# Patient Record
Sex: Female | Born: 1945 | Race: Black or African American | Hispanic: No | Smoking: Never smoker
Health system: Southern US, Community
[De-identification: ages and names within clinical notes are randomized; demographics above are authoritative.]

## PROBLEM LIST (undated history)

## (undated) DIAGNOSIS — E785 Hyperlipidemia, unspecified: Secondary | ICD-10-CM

## (undated) DIAGNOSIS — Z86711 Personal history of pulmonary embolism: Secondary | ICD-10-CM

## (undated) DIAGNOSIS — E538 Deficiency of other specified B group vitamins: Secondary | ICD-10-CM

## (undated) DIAGNOSIS — J302 Other seasonal allergic rhinitis: Secondary | ICD-10-CM

## (undated) DIAGNOSIS — K219 Gastro-esophageal reflux disease without esophagitis: Secondary | ICD-10-CM

## (undated) DIAGNOSIS — E7800 Pure hypercholesterolemia, unspecified: Secondary | ICD-10-CM

## (undated) DIAGNOSIS — R0789 Other chest pain: Secondary | ICD-10-CM

## (undated) DIAGNOSIS — M8580 Other specified disorders of bone density and structure, unspecified site: Secondary | ICD-10-CM

## (undated) DIAGNOSIS — G8929 Other chronic pain: Secondary | ICD-10-CM

## (undated) DIAGNOSIS — I10 Essential (primary) hypertension: Secondary | ICD-10-CM

## (undated) HISTORY — DX: Other chest pain: R07.89

## (undated) HISTORY — PX: TONSILLECTOMY: SUR1361

## (undated) HISTORY — DX: Hyperlipidemia, unspecified: E78.5

## (undated) HISTORY — PX: ABDOMINAL HYSTERECTOMY: SHX81

## (undated) HISTORY — DX: Deficiency of other specified B group vitamins: E53.8

## (undated) HISTORY — DX: Personal history of pulmonary embolism: Z86.711

## (undated) HISTORY — DX: Other specified disorders of bone density and structure, unspecified site: M85.80

## (undated) HISTORY — DX: Other seasonal allergic rhinitis: J30.2

## (undated) HISTORY — DX: Other chronic pain: G89.29

## (undated) HISTORY — PX: APPENDECTOMY: SHX54

## (undated) HISTORY — PX: NASAL SINUS SURGERY: SHX719

---

## 1981-07-02 DIAGNOSIS — Z86711 Personal history of pulmonary embolism: Secondary | ICD-10-CM

## 1981-07-02 HISTORY — DX: Personal history of pulmonary embolism: Z86.711

## 1998-01-16 ENCOUNTER — Emergency Department (HOSPITAL_COMMUNITY): Admission: EM | Admit: 1998-01-16 | Discharge: 1998-01-16 | Payer: Self-pay | Admitting: Emergency Medicine

## 1998-07-20 ENCOUNTER — Encounter: Admission: RE | Admit: 1998-07-20 | Discharge: 1998-07-20 | Payer: Self-pay | Admitting: Internal Medicine

## 1998-07-20 ENCOUNTER — Ambulatory Visit (HOSPITAL_COMMUNITY): Admission: RE | Admit: 1998-07-20 | Discharge: 1998-07-20 | Payer: Self-pay | Admitting: Internal Medicine

## 1998-08-03 ENCOUNTER — Encounter: Admission: RE | Admit: 1998-08-03 | Discharge: 1998-08-03 | Payer: Self-pay | Admitting: Internal Medicine

## 1998-08-04 ENCOUNTER — Ambulatory Visit (HOSPITAL_COMMUNITY): Admission: RE | Admit: 1998-08-04 | Discharge: 1998-08-04 | Payer: Self-pay | Admitting: *Deleted

## 1998-12-09 ENCOUNTER — Encounter: Payer: Self-pay | Admitting: Internal Medicine

## 1998-12-09 ENCOUNTER — Ambulatory Visit (HOSPITAL_COMMUNITY): Admission: RE | Admit: 1998-12-09 | Discharge: 1998-12-09 | Payer: Self-pay | Admitting: Internal Medicine

## 1998-12-09 ENCOUNTER — Encounter: Admission: RE | Admit: 1998-12-09 | Discharge: 1998-12-09 | Payer: Self-pay | Admitting: Internal Medicine

## 1999-01-18 ENCOUNTER — Encounter: Admission: RE | Admit: 1999-01-18 | Discharge: 1999-01-18 | Payer: Self-pay | Admitting: Hematology and Oncology

## 1999-03-16 ENCOUNTER — Encounter: Admission: RE | Admit: 1999-03-16 | Discharge: 1999-03-16 | Payer: Self-pay | Admitting: Internal Medicine

## 1999-04-21 ENCOUNTER — Encounter: Admission: RE | Admit: 1999-04-21 | Discharge: 1999-04-21 | Payer: Self-pay | Admitting: Internal Medicine

## 1999-09-29 ENCOUNTER — Encounter: Admission: RE | Admit: 1999-09-29 | Discharge: 1999-09-29 | Payer: Self-pay | Admitting: Internal Medicine

## 2000-02-09 ENCOUNTER — Encounter: Admission: RE | Admit: 2000-02-09 | Discharge: 2000-02-09 | Payer: Self-pay | Admitting: Internal Medicine

## 2000-02-09 ENCOUNTER — Ambulatory Visit (HOSPITAL_COMMUNITY): Admission: RE | Admit: 2000-02-09 | Discharge: 2000-02-09 | Payer: Self-pay | Admitting: Internal Medicine

## 2000-02-12 ENCOUNTER — Encounter: Admission: RE | Admit: 2000-02-12 | Discharge: 2000-03-19 | Payer: Self-pay | Admitting: Infectious Diseases

## 2000-02-14 ENCOUNTER — Ambulatory Visit (HOSPITAL_COMMUNITY): Admission: RE | Admit: 2000-02-14 | Discharge: 2000-02-14 | Payer: Self-pay

## 2000-02-21 ENCOUNTER — Encounter: Admission: RE | Admit: 2000-02-21 | Discharge: 2000-03-19 | Payer: Self-pay | Admitting: *Deleted

## 2000-08-23 ENCOUNTER — Encounter: Admission: RE | Admit: 2000-08-23 | Discharge: 2000-08-23 | Payer: Self-pay | Admitting: Internal Medicine

## 2000-10-04 ENCOUNTER — Encounter: Admission: RE | Admit: 2000-10-04 | Discharge: 2000-10-04 | Payer: Self-pay | Admitting: Internal Medicine

## 2001-04-15 ENCOUNTER — Encounter: Payer: Self-pay | Admitting: Emergency Medicine

## 2001-04-15 ENCOUNTER — Emergency Department (HOSPITAL_COMMUNITY): Admission: EM | Admit: 2001-04-15 | Discharge: 2001-04-15 | Payer: Self-pay | Admitting: Emergency Medicine

## 2001-09-12 ENCOUNTER — Encounter: Admission: RE | Admit: 2001-09-12 | Discharge: 2001-09-12 | Payer: Self-pay | Admitting: Internal Medicine

## 2001-10-06 ENCOUNTER — Encounter: Admission: RE | Admit: 2001-10-06 | Discharge: 2001-10-06 | Payer: Self-pay | Admitting: Internal Medicine

## 2001-10-10 ENCOUNTER — Ambulatory Visit (HOSPITAL_COMMUNITY): Admission: RE | Admit: 2001-10-10 | Discharge: 2001-10-10 | Payer: Self-pay | Admitting: Internal Medicine

## 2001-11-13 ENCOUNTER — Encounter: Admission: RE | Admit: 2001-11-13 | Discharge: 2001-11-13 | Payer: Self-pay | Admitting: Internal Medicine

## 2002-01-13 ENCOUNTER — Encounter: Admission: RE | Admit: 2002-01-13 | Discharge: 2002-01-13 | Payer: Self-pay | Admitting: Internal Medicine

## 2002-01-25 ENCOUNTER — Emergency Department (HOSPITAL_COMMUNITY): Admission: EM | Admit: 2002-01-25 | Discharge: 2002-01-25 | Payer: Self-pay | Admitting: *Deleted

## 2002-02-09 ENCOUNTER — Encounter: Admission: RE | Admit: 2002-02-09 | Discharge: 2002-02-09 | Payer: Self-pay | Admitting: Internal Medicine

## 2002-03-06 ENCOUNTER — Ambulatory Visit (HOSPITAL_COMMUNITY): Admission: RE | Admit: 2002-03-06 | Discharge: 2002-03-06 | Payer: Self-pay | Admitting: Internal Medicine

## 2002-03-06 ENCOUNTER — Encounter: Admission: RE | Admit: 2002-03-06 | Discharge: 2002-03-06 | Payer: Self-pay | Admitting: Internal Medicine

## 2002-04-27 ENCOUNTER — Encounter: Admission: RE | Admit: 2002-04-27 | Discharge: 2002-04-27 | Payer: Self-pay | Admitting: Internal Medicine

## 2002-05-08 ENCOUNTER — Encounter: Admission: RE | Admit: 2002-05-08 | Discharge: 2002-05-08 | Payer: Self-pay | Admitting: Internal Medicine

## 2002-06-21 ENCOUNTER — Encounter: Payer: Self-pay | Admitting: Internal Medicine

## 2002-06-21 ENCOUNTER — Ambulatory Visit (HOSPITAL_COMMUNITY): Admission: RE | Admit: 2002-06-21 | Discharge: 2002-06-21 | Payer: Self-pay | Admitting: Internal Medicine

## 2002-06-30 ENCOUNTER — Encounter: Admission: RE | Admit: 2002-06-30 | Discharge: 2002-06-30 | Payer: Self-pay | Admitting: Internal Medicine

## 2002-08-04 ENCOUNTER — Encounter: Admission: RE | Admit: 2002-08-04 | Discharge: 2002-08-04 | Payer: Self-pay | Admitting: Internal Medicine

## 2002-08-06 ENCOUNTER — Encounter: Admission: RE | Admit: 2002-08-06 | Discharge: 2002-08-06 | Payer: Self-pay | Admitting: Internal Medicine

## 2002-08-24 ENCOUNTER — Encounter: Admission: RE | Admit: 2002-08-24 | Discharge: 2002-08-24 | Payer: Self-pay | Admitting: Internal Medicine

## 2002-10-07 ENCOUNTER — Encounter: Admission: RE | Admit: 2002-10-07 | Discharge: 2002-10-07 | Payer: Self-pay | Admitting: Internal Medicine

## 2003-02-10 ENCOUNTER — Emergency Department (HOSPITAL_COMMUNITY): Admission: EM | Admit: 2003-02-10 | Discharge: 2003-02-10 | Payer: Self-pay | Admitting: Emergency Medicine

## 2003-02-10 ENCOUNTER — Encounter: Payer: Self-pay | Admitting: Emergency Medicine

## 2003-06-09 ENCOUNTER — Encounter: Admission: RE | Admit: 2003-06-09 | Discharge: 2003-06-09 | Payer: Self-pay | Admitting: Internal Medicine

## 2003-07-29 ENCOUNTER — Encounter: Admission: RE | Admit: 2003-07-29 | Discharge: 2003-07-29 | Payer: Self-pay | Admitting: Internal Medicine

## 2003-07-29 ENCOUNTER — Ambulatory Visit (HOSPITAL_COMMUNITY): Admission: RE | Admit: 2003-07-29 | Discharge: 2003-07-29 | Payer: Self-pay | Admitting: Internal Medicine

## 2003-08-06 ENCOUNTER — Emergency Department (HOSPITAL_COMMUNITY): Admission: AD | Admit: 2003-08-06 | Discharge: 2003-08-06 | Payer: Self-pay | Admitting: Family Medicine

## 2003-08-12 ENCOUNTER — Encounter: Admission: RE | Admit: 2003-08-12 | Discharge: 2003-08-12 | Payer: Self-pay | Admitting: Internal Medicine

## 2003-08-20 ENCOUNTER — Encounter: Payer: Self-pay | Admitting: Cardiovascular Disease

## 2003-08-20 ENCOUNTER — Ambulatory Visit (HOSPITAL_COMMUNITY): Admission: RE | Admit: 2003-08-20 | Discharge: 2003-08-20 | Payer: Self-pay | Admitting: Internal Medicine

## 2003-09-14 ENCOUNTER — Ambulatory Visit (HOSPITAL_COMMUNITY): Admission: RE | Admit: 2003-09-14 | Discharge: 2003-09-14 | Payer: Self-pay | Admitting: Internal Medicine

## 2003-09-21 ENCOUNTER — Encounter: Admission: RE | Admit: 2003-09-21 | Discharge: 2003-09-21 | Payer: Self-pay | Admitting: Internal Medicine

## 2003-12-07 ENCOUNTER — Encounter: Admission: RE | Admit: 2003-12-07 | Discharge: 2003-12-07 | Payer: Self-pay | Admitting: Internal Medicine

## 2003-12-07 ENCOUNTER — Ambulatory Visit (HOSPITAL_COMMUNITY): Admission: RE | Admit: 2003-12-07 | Discharge: 2003-12-07 | Payer: Self-pay | Admitting: Internal Medicine

## 2003-12-14 ENCOUNTER — Encounter: Admission: RE | Admit: 2003-12-14 | Discharge: 2003-12-14 | Payer: Self-pay | Admitting: Internal Medicine

## 2004-01-20 ENCOUNTER — Encounter: Admission: RE | Admit: 2004-01-20 | Discharge: 2004-01-20 | Payer: Self-pay | Admitting: Internal Medicine

## 2004-01-28 ENCOUNTER — Encounter: Admission: RE | Admit: 2004-01-28 | Discharge: 2004-01-28 | Payer: Self-pay | Admitting: Internal Medicine

## 2004-02-23 ENCOUNTER — Ambulatory Visit (HOSPITAL_COMMUNITY): Admission: RE | Admit: 2004-02-23 | Discharge: 2004-02-23 | Payer: Self-pay | Admitting: Orthopaedic Surgery

## 2004-03-01 ENCOUNTER — Encounter: Admission: RE | Admit: 2004-03-01 | Discharge: 2004-03-01 | Payer: Self-pay | Admitting: Internal Medicine

## 2004-03-02 ENCOUNTER — Ambulatory Visit: Payer: Self-pay | Admitting: Internal Medicine

## 2004-03-02 ENCOUNTER — Ambulatory Visit (HOSPITAL_COMMUNITY): Admission: RE | Admit: 2004-03-02 | Discharge: 2004-03-02 | Payer: Self-pay | Admitting: Internal Medicine

## 2004-07-21 ENCOUNTER — Encounter: Admission: RE | Admit: 2004-07-21 | Discharge: 2004-07-21 | Payer: Self-pay | Admitting: Sports Medicine

## 2004-08-10 ENCOUNTER — Ambulatory Visit (HOSPITAL_COMMUNITY): Admission: RE | Admit: 2004-08-10 | Discharge: 2004-08-10 | Payer: Self-pay | Admitting: Cardiology

## 2005-08-23 ENCOUNTER — Ambulatory Visit: Payer: Self-pay | Admitting: Internal Medicine

## 2005-09-18 ENCOUNTER — Ambulatory Visit: Payer: Self-pay | Admitting: Hospitalist

## 2005-10-11 ENCOUNTER — Ambulatory Visit: Payer: Self-pay | Admitting: Internal Medicine

## 2005-10-19 ENCOUNTER — Ambulatory Visit (HOSPITAL_COMMUNITY): Admission: RE | Admit: 2005-10-19 | Discharge: 2005-10-19 | Payer: Self-pay | Admitting: Internal Medicine

## 2005-10-26 ENCOUNTER — Emergency Department (HOSPITAL_COMMUNITY): Admission: EM | Admit: 2005-10-26 | Discharge: 2005-10-26 | Payer: Self-pay | Admitting: Family Medicine

## 2006-05-14 DIAGNOSIS — K219 Gastro-esophageal reflux disease without esophagitis: Secondary | ICD-10-CM

## 2006-05-14 DIAGNOSIS — Z86718 Personal history of other venous thrombosis and embolism: Secondary | ICD-10-CM

## 2006-05-14 DIAGNOSIS — J45909 Unspecified asthma, uncomplicated: Secondary | ICD-10-CM | POA: Insufficient documentation

## 2008-02-03 ENCOUNTER — Emergency Department (HOSPITAL_COMMUNITY): Admission: EM | Admit: 2008-02-03 | Discharge: 2008-02-03 | Payer: Self-pay | Admitting: Family Medicine

## 2008-02-09 ENCOUNTER — Emergency Department (HOSPITAL_COMMUNITY): Admission: EM | Admit: 2008-02-09 | Discharge: 2008-02-09 | Payer: Self-pay | Admitting: Emergency Medicine

## 2008-02-16 ENCOUNTER — Emergency Department (HOSPITAL_COMMUNITY): Admission: EM | Admit: 2008-02-16 | Discharge: 2008-02-16 | Payer: Self-pay | Admitting: Emergency Medicine

## 2008-05-11 ENCOUNTER — Encounter (INDEPENDENT_AMBULATORY_CARE_PROVIDER_SITE_OTHER): Payer: Self-pay | Admitting: Internal Medicine

## 2008-06-01 ENCOUNTER — Ambulatory Visit: Payer: Self-pay | Admitting: Surgery

## 2008-06-02 ENCOUNTER — Ambulatory Visit: Payer: Self-pay | Admitting: Internal Medicine

## 2008-06-02 ENCOUNTER — Encounter (INDEPENDENT_AMBULATORY_CARE_PROVIDER_SITE_OTHER): Payer: Self-pay | Admitting: Internal Medicine

## 2008-06-02 ENCOUNTER — Ambulatory Visit (HOSPITAL_COMMUNITY): Admission: RE | Admit: 2008-06-02 | Discharge: 2008-06-02 | Payer: Self-pay | Admitting: Internal Medicine

## 2008-06-02 ENCOUNTER — Encounter: Payer: Self-pay | Admitting: Internal Medicine

## 2008-06-07 LAB — CONVERTED CEMR LAB
ALT: 15 units/L (ref 0–35)
Albumin: 4.4 g/dL (ref 3.5–5.2)
CO2: 23 meq/L (ref 19–32)
Calcium: 9.7 mg/dL (ref 8.4–10.5)
Chloride: 107 meq/L (ref 96–112)
Cholesterol: 211 mg/dL — ABNORMAL HIGH (ref 0–200)
Creatinine, Ser: 0.83 mg/dL (ref 0.40–1.20)
HCT: 41.6 % (ref 36.0–46.0)
Hemoglobin: 13.6 g/dL (ref 12.0–15.0)
Platelets: 201 10*3/uL (ref 150–400)
Potassium: 3.7 meq/L (ref 3.5–5.3)
Total CHOL/HDL Ratio: 4.9
WBC: 6.8 10*3/uL (ref 4.0–10.5)

## 2008-06-16 ENCOUNTER — Ambulatory Visit: Payer: Self-pay | Admitting: Internal Medicine

## 2008-06-16 ENCOUNTER — Ambulatory Visit (HOSPITAL_COMMUNITY): Admission: RE | Admit: 2008-06-16 | Discharge: 2008-06-16 | Payer: Self-pay | Admitting: Internal Medicine

## 2008-06-16 ENCOUNTER — Encounter: Payer: Self-pay | Admitting: Licensed Clinical Social Worker

## 2008-06-28 ENCOUNTER — Encounter (INDEPENDENT_AMBULATORY_CARE_PROVIDER_SITE_OTHER): Payer: Self-pay | Admitting: Internal Medicine

## 2008-07-02 DIAGNOSIS — E538 Deficiency of other specified B group vitamins: Secondary | ICD-10-CM

## 2008-07-02 DIAGNOSIS — M8580 Other specified disorders of bone density and structure, unspecified site: Secondary | ICD-10-CM

## 2008-07-02 HISTORY — DX: Deficiency of other specified B group vitamins: E53.8

## 2008-07-02 HISTORY — DX: Other specified disorders of bone density and structure, unspecified site: M85.80

## 2008-07-05 ENCOUNTER — Emergency Department (HOSPITAL_COMMUNITY): Admission: EM | Admit: 2008-07-05 | Discharge: 2008-07-06 | Payer: Self-pay | Admitting: Emergency Medicine

## 2008-07-05 ENCOUNTER — Telehealth (INDEPENDENT_AMBULATORY_CARE_PROVIDER_SITE_OTHER): Payer: Self-pay | Admitting: Internal Medicine

## 2008-07-06 ENCOUNTER — Ambulatory Visit: Payer: Self-pay | Admitting: Internal Medicine

## 2008-07-15 ENCOUNTER — Ambulatory Visit (HOSPITAL_COMMUNITY): Admission: RE | Admit: 2008-07-15 | Discharge: 2008-07-15 | Payer: Self-pay | Admitting: Internal Medicine

## 2008-07-21 ENCOUNTER — Ambulatory Visit: Payer: Self-pay | Admitting: Internal Medicine

## 2008-07-21 ENCOUNTER — Encounter (INDEPENDENT_AMBULATORY_CARE_PROVIDER_SITE_OTHER): Payer: Self-pay | Admitting: Internal Medicine

## 2008-07-23 LAB — CONVERTED CEMR LAB
CO2: 21 meq/L (ref 19–32)
Chloride: 106 meq/L (ref 96–112)
Potassium: 3.7 meq/L (ref 3.5–5.3)
Sodium: 142 meq/L (ref 135–145)

## 2008-07-26 ENCOUNTER — Ambulatory Visit: Payer: Self-pay | Admitting: Internal Medicine

## 2008-07-27 ENCOUNTER — Ambulatory Visit: Payer: Self-pay | Admitting: Infectious Disease

## 2008-07-28 ENCOUNTER — Ambulatory Visit: Payer: Self-pay | Admitting: Internal Medicine

## 2008-07-29 ENCOUNTER — Ambulatory Visit: Payer: Self-pay | Admitting: Internal Medicine

## 2008-07-30 ENCOUNTER — Ambulatory Visit: Payer: Self-pay | Admitting: Internal Medicine

## 2008-08-05 ENCOUNTER — Emergency Department (HOSPITAL_COMMUNITY): Admission: EM | Admit: 2008-08-05 | Discharge: 2008-08-05 | Payer: Self-pay | Admitting: Emergency Medicine

## 2008-11-26 ENCOUNTER — Telehealth: Payer: Self-pay | Admitting: *Deleted

## 2008-11-30 LAB — HM DEXA SCAN

## 2008-12-02 ENCOUNTER — Emergency Department (HOSPITAL_COMMUNITY): Admission: EM | Admit: 2008-12-02 | Discharge: 2008-12-02 | Payer: Self-pay | Admitting: Emergency Medicine

## 2008-12-10 ENCOUNTER — Encounter (INDEPENDENT_AMBULATORY_CARE_PROVIDER_SITE_OTHER): Payer: Self-pay | Admitting: Internal Medicine

## 2008-12-10 ENCOUNTER — Ambulatory Visit: Payer: Self-pay | Admitting: Internal Medicine

## 2008-12-13 ENCOUNTER — Ambulatory Visit: Payer: Self-pay | Admitting: Internal Medicine

## 2008-12-21 ENCOUNTER — Ambulatory Visit (HOSPITAL_COMMUNITY): Admission: RE | Admit: 2008-12-21 | Discharge: 2008-12-21 | Payer: Self-pay | Admitting: Internal Medicine

## 2009-01-11 ENCOUNTER — Telehealth (INDEPENDENT_AMBULATORY_CARE_PROVIDER_SITE_OTHER): Payer: Self-pay | Admitting: Internal Medicine

## 2009-03-09 ENCOUNTER — Ambulatory Visit: Payer: Self-pay | Admitting: Infectious Diseases

## 2009-04-06 ENCOUNTER — Telehealth (INDEPENDENT_AMBULATORY_CARE_PROVIDER_SITE_OTHER): Payer: Self-pay | Admitting: Internal Medicine

## 2009-06-02 ENCOUNTER — Telehealth (INDEPENDENT_AMBULATORY_CARE_PROVIDER_SITE_OTHER): Payer: Self-pay | Admitting: Internal Medicine

## 2009-06-21 ENCOUNTER — Telehealth (INDEPENDENT_AMBULATORY_CARE_PROVIDER_SITE_OTHER): Payer: Self-pay | Admitting: Internal Medicine

## 2009-06-21 ENCOUNTER — Emergency Department (HOSPITAL_COMMUNITY): Admission: EM | Admit: 2009-06-21 | Discharge: 2009-06-21 | Payer: Self-pay | Admitting: Emergency Medicine

## 2009-06-29 ENCOUNTER — Ambulatory Visit: Payer: Self-pay | Admitting: Internal Medicine

## 2009-06-30 ENCOUNTER — Ambulatory Visit: Payer: Self-pay | Admitting: Internal Medicine

## 2009-07-01 ENCOUNTER — Encounter (INDEPENDENT_AMBULATORY_CARE_PROVIDER_SITE_OTHER): Payer: Self-pay | Admitting: Internal Medicine

## 2009-07-02 DIAGNOSIS — R0789 Other chest pain: Secondary | ICD-10-CM

## 2009-07-02 HISTORY — DX: Other chest pain: R07.89

## 2009-07-06 LAB — CONVERTED CEMR LAB
ALT: 13 units/L (ref 0–35)
AST: 18 units/L (ref 0–37)
CO2: 22 meq/L (ref 19–32)
Calcium: 10.3 mg/dL (ref 8.4–10.5)
Chloride: 110 meq/L (ref 96–112)
Cholesterol: 217 mg/dL — ABNORMAL HIGH (ref 0–200)
Sodium: 145 meq/L (ref 135–145)
Total CHOL/HDL Ratio: 4.4
Total Protein: 6.8 g/dL (ref 6.0–8.3)
VLDL: 18 mg/dL (ref 0–40)

## 2009-08-10 ENCOUNTER — Telehealth (INDEPENDENT_AMBULATORY_CARE_PROVIDER_SITE_OTHER): Payer: Self-pay | Admitting: Internal Medicine

## 2009-08-12 ENCOUNTER — Encounter (INDEPENDENT_AMBULATORY_CARE_PROVIDER_SITE_OTHER): Payer: Self-pay | Admitting: Internal Medicine

## 2009-08-12 ENCOUNTER — Telehealth (INDEPENDENT_AMBULATORY_CARE_PROVIDER_SITE_OTHER): Payer: Self-pay | Admitting: Internal Medicine

## 2009-09-05 ENCOUNTER — Ambulatory Visit: Payer: Self-pay | Admitting: Infectious Diseases

## 2009-09-05 ENCOUNTER — Ambulatory Visit (HOSPITAL_COMMUNITY): Admission: RE | Admit: 2009-09-05 | Discharge: 2009-09-05 | Payer: Self-pay | Admitting: Infectious Diseases

## 2009-09-05 DIAGNOSIS — I10 Essential (primary) hypertension: Secondary | ICD-10-CM | POA: Insufficient documentation

## 2009-09-07 ENCOUNTER — Ambulatory Visit: Payer: Self-pay | Admitting: Internal Medicine

## 2009-09-07 LAB — FECAL OCCULT BLOOD, GUAIAC: Fecal Occult Blood: POSITIVE

## 2009-09-08 ENCOUNTER — Encounter: Admission: RE | Admit: 2009-09-08 | Discharge: 2009-09-08 | Payer: Self-pay | Admitting: Internal Medicine

## 2009-09-08 LAB — HM MAMMOGRAPHY: HM Mammogram: NEGATIVE

## 2009-09-12 LAB — CONVERTED CEMR LAB
OCCULT 1: NEGATIVE
OCCULT 2: POSITIVE
OCCULT 3: POSITIVE

## 2009-09-13 ENCOUNTER — Encounter (INDEPENDENT_AMBULATORY_CARE_PROVIDER_SITE_OTHER): Payer: Self-pay | Admitting: Internal Medicine

## 2009-09-15 ENCOUNTER — Encounter (INDEPENDENT_AMBULATORY_CARE_PROVIDER_SITE_OTHER): Payer: Self-pay | Admitting: Internal Medicine

## 2009-09-16 ENCOUNTER — Encounter: Payer: Self-pay | Admitting: Family Medicine

## 2009-09-16 ENCOUNTER — Ambulatory Visit: Payer: Self-pay | Admitting: Sports Medicine

## 2009-09-28 ENCOUNTER — Encounter: Admission: RE | Admit: 2009-09-28 | Discharge: 2009-12-27 | Payer: Self-pay | Admitting: Sports Medicine

## 2009-10-24 ENCOUNTER — Telehealth (INDEPENDENT_AMBULATORY_CARE_PROVIDER_SITE_OTHER): Payer: Self-pay | Admitting: Internal Medicine

## 2009-10-28 ENCOUNTER — Ambulatory Visit: Payer: Self-pay | Admitting: Family Medicine

## 2009-10-30 ENCOUNTER — Encounter: Payer: Self-pay | Admitting: Internal Medicine

## 2009-10-31 ENCOUNTER — Encounter: Payer: Self-pay | Admitting: Sports Medicine

## 2009-11-03 ENCOUNTER — Encounter: Admission: RE | Admit: 2009-11-03 | Discharge: 2010-02-01 | Payer: Self-pay | Admitting: Family Medicine

## 2009-11-04 ENCOUNTER — Ambulatory Visit: Payer: Self-pay | Admitting: Sports Medicine

## 2009-11-29 ENCOUNTER — Ambulatory Visit: Payer: Self-pay | Admitting: Internal Medicine

## 2009-11-29 ENCOUNTER — Encounter (INDEPENDENT_AMBULATORY_CARE_PROVIDER_SITE_OTHER): Payer: Self-pay | Admitting: Internal Medicine

## 2009-11-29 ENCOUNTER — Observation Stay (HOSPITAL_COMMUNITY): Admission: EM | Admit: 2009-11-29 | Discharge: 2009-12-01 | Payer: Self-pay | Admitting: Emergency Medicine

## 2009-11-29 LAB — CONVERTED CEMR LAB
Cholesterol: 219 mg/dL
LDL Cholesterol: 156 mg/dL
Triglycerides: 86 mg/dL

## 2009-11-30 ENCOUNTER — Encounter: Payer: Self-pay | Admitting: Internal Medicine

## 2009-12-01 ENCOUNTER — Encounter: Payer: Self-pay | Admitting: Family Medicine

## 2009-12-09 ENCOUNTER — Ambulatory Visit: Payer: Self-pay | Admitting: Family Medicine

## 2009-12-13 ENCOUNTER — Ambulatory Visit: Payer: Self-pay | Admitting: Internal Medicine

## 2009-12-29 ENCOUNTER — Encounter: Admission: RE | Admit: 2009-12-29 | Discharge: 2010-03-24 | Payer: Self-pay | Admitting: Sports Medicine

## 2010-01-12 ENCOUNTER — Ambulatory Visit: Payer: Self-pay | Admitting: Internal Medicine

## 2010-01-26 ENCOUNTER — Telehealth: Payer: Self-pay | Admitting: Internal Medicine

## 2010-02-01 ENCOUNTER — Telehealth: Payer: Self-pay | Admitting: Internal Medicine

## 2010-02-02 ENCOUNTER — Telehealth: Payer: Self-pay | Admitting: Internal Medicine

## 2010-02-20 ENCOUNTER — Encounter: Payer: Self-pay | Admitting: Internal Medicine

## 2010-02-21 ENCOUNTER — Ambulatory Visit: Payer: Self-pay | Admitting: Internal Medicine

## 2010-02-21 ENCOUNTER — Encounter: Payer: Self-pay | Admitting: Licensed Clinical Social Worker

## 2010-02-21 DIAGNOSIS — G4700 Insomnia, unspecified: Secondary | ICD-10-CM

## 2010-02-28 ENCOUNTER — Telehealth: Payer: Self-pay | Admitting: *Deleted

## 2010-03-24 ENCOUNTER — Ambulatory Visit: Payer: Self-pay | Admitting: Internal Medicine

## 2010-03-24 ENCOUNTER — Telehealth: Payer: Self-pay | Admitting: Internal Medicine

## 2010-03-28 ENCOUNTER — Telehealth: Payer: Self-pay | Admitting: Internal Medicine

## 2010-04-04 ENCOUNTER — Encounter: Payer: Self-pay | Admitting: Family Medicine

## 2010-04-11 ENCOUNTER — Telehealth: Payer: Self-pay | Admitting: Internal Medicine

## 2010-04-20 ENCOUNTER — Ambulatory Visit: Payer: Self-pay | Admitting: Internal Medicine

## 2010-04-27 ENCOUNTER — Ambulatory Visit: Payer: Self-pay | Admitting: Internal Medicine

## 2010-05-03 ENCOUNTER — Telehealth: Payer: Self-pay | Admitting: *Deleted

## 2010-05-08 ENCOUNTER — Telehealth: Payer: Self-pay

## 2010-05-18 ENCOUNTER — Telehealth: Payer: Self-pay | Admitting: Internal Medicine

## 2010-05-31 ENCOUNTER — Telehealth: Payer: Self-pay | Admitting: *Deleted

## 2010-05-31 ENCOUNTER — Ambulatory Visit: Payer: Self-pay | Admitting: Internal Medicine

## 2010-06-05 ENCOUNTER — Ambulatory Visit: Payer: Self-pay | Admitting: Family Medicine

## 2010-06-05 ENCOUNTER — Encounter: Admission: RE | Admit: 2010-06-05 | Discharge: 2010-06-05 | Payer: Self-pay | Admitting: Family Medicine

## 2010-06-09 ENCOUNTER — Telehealth: Payer: Self-pay | Admitting: Internal Medicine

## 2010-06-09 ENCOUNTER — Encounter: Payer: Self-pay | Admitting: Family Medicine

## 2010-06-12 ENCOUNTER — Ambulatory Visit: Payer: Self-pay | Admitting: Family Medicine

## 2010-06-28 ENCOUNTER — Encounter
Admission: RE | Admit: 2010-06-28 | Discharge: 2010-07-03 | Payer: Self-pay | Source: Home / Self Care | Attending: Family Medicine | Admitting: Family Medicine

## 2010-07-03 ENCOUNTER — Encounter: Admission: RE | Admit: 2010-07-03 | Payer: Self-pay | Source: Home / Self Care | Admitting: Sports Medicine

## 2010-07-03 ENCOUNTER — Encounter
Admission: RE | Admit: 2010-07-03 | Discharge: 2010-08-01 | Payer: Self-pay | Source: Home / Self Care | Attending: Family Medicine | Admitting: Family Medicine

## 2010-07-05 ENCOUNTER — Ambulatory Visit
Admission: RE | Admit: 2010-07-05 | Discharge: 2010-07-05 | Payer: Self-pay | Source: Home / Self Care | Attending: Internal Medicine | Admitting: Internal Medicine

## 2010-07-06 ENCOUNTER — Encounter: Admit: 2010-07-06 | Payer: Self-pay | Admitting: Family Medicine

## 2010-07-23 ENCOUNTER — Encounter: Payer: Self-pay | Admitting: Orthopaedic Surgery

## 2010-08-01 ENCOUNTER — Ambulatory Visit: Admit: 2010-08-01 | Payer: Self-pay | Admitting: Internal Medicine

## 2010-08-01 NOTE — Progress Notes (Signed)
Summary: refill/gg  Phone Note Refill Request  on May 18, 2010 2:09 PM  Refills Requested: Medication #1:  PROVENTIL HFA 108 (90 BASE) MCG/ACT AERS 2 puffs every four hours as needed for shortness of breath. Pt states she uses 3 inhalers a month. SHe uses 2 puffs every 4 hours.  she needs a 3 month supply.     Method Requested: Electronic Initial call taken by: Merrie Roof RN,  May 18, 2010 2:09 PM  Follow-up for Phone Call        When I saw her in Aug she stated only used as needed - usally when allergies flaired. If she requires it q 4 around the clock she needs seen ASAP and et PFT's .  There are 200 doses in each meter. Using 3 meters per month equals 20 puffs per day. One MDI should last acouple of months,  I rx #1 on 04/11/10 and gave 5 refills. I gave one MDI to give her time to get here and be evaluated. Follow-up by: Blanch Media MD,  May 18, 2010 3:58 PM  Additional Follow-up for Phone Call Additional follow up Details #1::        pt has appointment 11/22 with Dr Rogelia Boga. Additional Follow-up by: Merrie Roof RN,  May 22, 2010 4:20 PM    Prescriptions: PROVENTIL HFA 108 (90 BASE) MCG/ACT AERS (ALBUTEROL SULFATE) 2 puffs every four hours as needed for shortness of breath.  #1 x 0   Entered and Authorized by:   Blanch Media MD   Signed by:   Blanch Media MD on 05/18/2010   Method used:   Faxed to ...       [facility] Fax) (mail-order)             ,          Ph: [phone]       Fax: [phone]   RxID:   [id number]

## 2010-08-01 NOTE — Assessment & Plan Note (Signed)
Summary: LEFT LEG PAIN,MC   Vital Signs:  Patient profile:   65 year old female BP sitting:   139 / 81  Vitals Entered By: Lillia Pauls CMA (June 05, 2010 1:32 PM)  Primary Care Provider:  Blanch Media MD   History of Present Illness: RIGHT shoulder pain--worse withoverhgead movement. Has had similar issues many years ago with left shoulder--she received an injection and it has not bothered her since, Pain is sharp, not aching or throbbing. No hand or arm numbness or weakness. No specific injury . Has gradually gotten worse over last few weeks.   Right > left but B groin pain--thinks it is her hip on the right causing most of the issues. Worse wuth walking--has to stop after a few minutes of walking. Not keeping her awake at night. No falls. No leg numbnessor tingling. No urinary incontinence. No back ir hip injury or surgery  Current Medications (verified): 1)  Nexium 20 Mg Cpdr (Esomeprazole Magnesium) .... Take One Capsule Daily. 2)  Proventil Hfa 108 (90 Base) Mcg/act Aers (Albuterol Sulfate) .... 2 Puffs Every Four Hours As Needed For Shortness of Breath. 3)  Metoprolol Succinate 25 Mg Xr24h-Tab (Metoprolol Succinate) .... Take 1 Tablet By Mouth Once A Day 4)  Cyanocobalamin 1000 Mcg/ml Soln (Cyanocobalamin) .... Inject 1 Ml Every Day For One Week and Then Once A Week For One Month and Then Once A Month. 5)  Caltrate 600+d Plus 600-400 Mg-Unit Tabs (Calcium Carbonate-Vit D-Min) .... Take 1 Tablet By Mouth Two Times A Day 6)  Ambien 10 Mg Tabs (Zolpidem Tartrate) .... Take 1 Tablet By Mouth At Bedtime As Needed For Sleeping Problems 7)  Simvastatin 20 Mg Tabs (Simvastatin) .... Take 1 Tablet By Mouth At Bedtime For Cholesterol 8)  Aspirin Ec 81 Mg Tbec (Aspirin) .... Take 1 Tablet By Mouth Once A Day 9)  Patanol 0.1 % Soln (Olopatadine Hcl) .Marland Kitchen.. 1 Drop Into Both Eyes Two Times A Day  For Allergies  Allergies: 1)  ! Amoxicillin 2)  ! Aspirin 3)  ! Pcn 4)  ! * Nose Sprays  - Inc Steroid Sprays  Past History:  Past Medical History: Last updated: 02/20/2010 Thrombosis     H/o DVT: L LE '85,'87,'89     H/o Pulmonary embolism:1983  Asthma     Albuterol MDI as needed  GERD       Maintained on PPI  Osteopenia     DEXA 6/10 T scores lumbar spine -2.2 to -2.3  Hyperlipidemia     Maintained on statin  HTN  Vit B 12 def     Dx and started tx 1/10     B 12 level decreased to 171 May 2011  Insomnia    Pain syndrome     OA R hip - evaluated by orth     Trochanteric bursitis s/p steroid injection     L foot pain 2/2 old fx  Seasonal allergies  Atypical CP     Myoview negative 5/11     EF 63%  Past Surgical History: Last updated: 05/14/2006 S/p Appendectomy S/p Hysterectomy-partial S/p Oophorectomy S/p Sinus surgery S/p Tonsillectomy  Social History: Last updated: 07/06/2008 Retired Copywriter, 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    Physical Exam  General:  alert, well-developed, well-nourished, and well-hydrated.   Msk:  RIGHT shoulder: pain  with supraspinatus testing but intact strength in all planes of rotator cuff. Distally neurovascularly iuntact. Limited IR on right compared with left  HIPS B have full painless IR/ER. cannot reproduce her pain with hip exam. LE strength 5/5 symmetrical.   GAIT normal.   BACK SLR negative B. normal flexion and hyperextension for age.  BUTTOCKS nontender to palpation over SI joint. + FABER right   Impression & Recommendations:  Problem # 1:  PAIN IN JOINT PELVIC REGION AND THIGH (ICD-719.45) Unclear if her low back and groin pain are from same problem (likely back) or if she has some back issues and SI joiint issues. Will start with plain films of back and I wil call her after we see those anddecide on next  step.  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  Complete Medication List: 1)  Nexium 20 Mg Cpdr (Esomeprazole magnesium) .... Take one capsule daily. 2)  Proventil Hfa 108 (90 Base) Mcg/act Aers (Albuterol sulfate) .... 2 puffs every four hours as needed for shortness of breath. 3)  Metoprolol Succinate 25 Mg Xr24h-tab (Metoprolol succinate) .... Take 1 tablet by mouth once a day 4)  Cyanocobalamin 1000 Mcg/ml Soln (Cyanocobalamin) .... Inject 1 ml every day for one week and then once a week for one month and then once a month. 5)  Caltrate 600+d Plus 600-400 Mg-unit Tabs (Calcium carbonate-vit d-min) .... Take 1 tablet by mouth two times a day 6)  Ambien 10 Mg Tabs (Zolpidem tartrate) .... Take 1 tablet by mouth at bedtime as needed for sleeping problems 7)  Simvastatin 20 Mg Tabs (Simvastatin) .... Take 1 tablet by mouth at bedtime for cholesterol 8)  Aspirin Ec 81 Mg Tbec (Aspirin) .... Take 1 tablet by mouth once a day 9)  Patanol 0.1 % Soln (Olopatadine hcl) .Marland Kitchen.. 1 drop into both eyes two times a day  for allergies Radiology other (Radiology Other)   Orders Added: 1)  Radiology other [Radiology Other] 2)  Est. Patient Level IV [16109]     Impression & Recommendations:  Her updated medication list for this problem includes:    Aspirin Ec 81 Mg Tbec (Aspirin) .Marland Kitchen... Take 1 tablet by mouth once a day   Her updated medication list for this problem includes:    Aspirin Ec 81 Mg Tbec (Aspirin) .Marland Kitchen... Take 1 tablet by mouth once a day  Orders: Radiology other (Radiology Other)   Her updated medication list for this problem includes:    Caltrate 600+d Plus 600-400 Mg-unit Tabs (Calcium carbonate-vit d-min) .Marland Kitchen... Take 1 tablet by mouth two times a day   Her updated  medication list for this problem includes:    Aspirin Ec 81 Mg Tbec (Aspirin) .Marland Kitchen... Take 1 tablet by mouth once a day   Complete Medication List: 1)  Nexium 20 Mg Cpdr (Esomeprazole magnesium) .... Take one capsule daily. 2)  Proventil Hfa 108 (90 Base) Mcg/act Aers (Albuterol sulfate) .... 2 puffs every four hours as needed for shortness of breath. 3)  Metoprolol Succinate 25 Mg Xr24h-tab (Metoprolol succinate) .... Take 1 tablet by mouth once a day 4)  Cyanocobalamin 1000 Mcg/ml Soln (Cyanocobalamin) .... Inject 1 ml every day for one week and then once a week for one month and then once a month. 5)  Caltrate  600+d Plus 600-400 Mg-unit Tabs (Calcium carbonate-vit d-min) .... Take 1 tablet by mouth two times a day 6)  Ambien 10 Mg Tabs (Zolpidem tartrate) .... Take 1 tablet by mouth at bedtime as needed for sleeping problems 7)  Simvastatin 20 Mg Tabs (Simvastatin) .... Take 1 tablet by mouth at bedtime for cholesterol 8)  Aspirin Ec 81 Mg Tbec (Aspirin) .... Take 1 tablet by mouth once a day 9)  Patanol 0.1 % Soln (Olopatadine hcl) .Marland Kitchen.. 1 drop into both eyes two times a day  for allergies

## 2010-08-01 NOTE — Miscellaneous (Signed)
Summary: [facility]  [facility]   Imported By: Marily Memos 04/04/2010 10:18:12  _____________________________________________________________________  External Attachment:    Type:   Image     Comment:   External Document

## 2010-08-01 NOTE — Medication Information (Signed)
Summary: Tax adviser   Imported By: Florinda Marker 08/15/2009 15:13:42  _____________________________________________________________________  External Attachment:    Type:   Image     Comment:   External Document

## 2010-08-01 NOTE — Letter (Signed)
Summary: [employer]  C[employer]   Imported By: Margie Billet 10/12/2009 12:25:00  _____________________________________________________________________  External Attachment:    Type:   Image     Comment:   External Document

## 2010-08-01 NOTE — Progress Notes (Signed)
Summary: Refill/gh  Phone Note Refill Request Message from:  Fax from Pharmacy on October 24, 2009 10:12 AM  Refills Requested: Medication #1:  ALLEGRA 180 MG TABS Take 1 tablet by mouth once a day   Last Refilled: 03/07/2009  Method Requested: Electronic Initial call taken by: Angelina Ok RN,  October 24, 2009 10:13 AM    Prescriptions: ALLEGRA 180 MG TABS (FEXOFENADINE HCL) Take 1 tablet by mouth once a day  #90 x 6   Entered and Authorized by:   Elby Showers MD   Signed by:   Elby Showers MD on 10/25/2009   Method used:   [facility]        [facility] [address] [phone]* (retail)       [address]       [place], [place]  [address]       Ph: [phone] or [phone]       Fax: [phone]   RxID:   [id number]

## 2010-08-01 NOTE — Letter (Signed)
Summary: [facility] PT referral form  [facility] referral [facility]   Imported By: Marily Memos 10/28/2009 10:19:53  _____________________________________________________________________  External Attachment:    Type:   Image     Comment:   External Document

## 2010-08-01 NOTE — Letter (Signed)
Summary: [facility] Referral [facility]  [facility] Referral [facility]   Imported By: Marily Memos 09/16/2009 11:20:02  _____________________________________________________________________  External Attachment:    Type:   Image     Comment:   External Document

## 2010-08-01 NOTE — Progress Notes (Signed)
Summary: refill/ hla  Phone Note Refill Request Message from:  Patient on March 24, 2010 3:47 PM  Refills Requested: Medication #1:  ALLEGRA 180 MG TABS Take 1 tablet by mouth once a day   Dosage confirmed as above?Dosage Confirmed last visit 8/23  Initial call taken by: Marin Roberts RN,  March 24, 2010 3:47 PM  Follow-up for Phone Call       Follow-up by: Blanch Media MD,  March 24, 2010 4:10 PM    Prescriptions: ALLEGRA 180 MG TABS (FEXOFENADINE HCL) Take 1 tablet by mouth once a day  #90 x 3   Entered and Authorized by:   Blanch Media MD   Signed by:   Blanch Media MD on 03/24/2010   Method used:   [facility]        [facility] [phone]* (retail)       [address]       [place], [place]  [address]       Ph: [phone] or [phone]       Fax: [phone]   RxID:   [id number]

## 2010-08-01 NOTE — Progress Notes (Signed)
Summary: med refill/gp  Phone Note Refill Request Message from:  Patient on January 26, 2010 12:01 PM  Refills Requested: Medication #1:  ALBUTEROL SULFATE  NEBU Inhale 2 puffs every 4-6hours as needed  Medication #2:  NEXIUM 20 MG CPDR Take one capsule daily.  Medication #3:  AMBIEN 10 MG TABS Take 1 tablet by mouth once a day Requests 90 day supplies. Next appt. Aug 8.   Method Requested: Telephone to Pharmacy Initial call taken by: Chinita Pester RN,  January 26, 2010 12:01 PM  Follow-up for Phone Call        Would you pls call Ambien to the pharmacy?  Thanks Follow-up by: Blanch Media MD,  January 26, 2010 1:52 PM  Additional Follow-up for Phone Call Additional follow up Details #1::        Ambien Rx called to pharmacy - [facility]. Additional Follow-up by: Chinita Pester RN,  January 27, 2010 11:32 AM    Prescriptions: AMBIEN 10 MG TABS (ZOLPIDEM TARTRATE) Take 1 tablet by mouth once a day  #90 x 0   Entered and Authorized by:   Blanch Media MD   Signed by:   Blanch Media MD on 01/26/2010   Method used:   Telephoned to ...       [facility] Fax) (mail-order)             ,          Ph: [phone]       Fax: [phone]   RxID:   [id number] NEXIUM 20 MG CPDR (ESOMEPRAZOLE MAGNESIUM) Take one capsule daily.  #90 x 3   Entered and Authorized by:   Blanch Media MD   Signed by:   Blanch Media MD on 01/26/2010   Method used:   Faxed to ...       [facility] Fax) (mail-order)             ,          Ph: [phone]       Fax: [phone]   RxID:   [id number] ALBUTEROL SULFATE  NEBU (ALBUTEROL SULFATE NEBU) Inhale 2 puffs every 4-6hours as needed  #3 x 0   Entered and Authorized by:   Blanch Media MD   Signed by:   Blanch Media MD on 01/26/2010   Method used:   Faxed to ...       [facility] Fax) (mail-order)             ,          Ph: [phone]       Fax: [phone]  RxID:   [id number]

## 2010-08-01 NOTE — Consult Note (Signed)
Summary: [facility]   Imported By: Florinda Marker 09/19/2009 15:34:56  _____________________________________________________________________  External Attachment:    Type:   Image     Comment:   External Document

## 2010-08-01 NOTE — Assessment & Plan Note (Signed)
Summary: FU FOOT & RT SHOULDER/MJD   Vital Signs:  Patient profile:   65 year old female BP sitting:   141 / 83  Vitals Entered By: Lillia Pauls CMA (December 09, 2009 8:47 AM)  Primary Care Provider:  Elby Showers MD   History of Present Illness: f/u right shoulder pain---90% improved. finished her PT.  2) Right hip pain also greatly improved (90-95%)  3) Now that shoulder on r  is better she realizes her right neck and  trapezius still hurt. Also some neck stiffness. No neck injury.  Allergies: 1)  ! Amoxicillin 2)  ! Aspirin 3)  ! Pcn  Physical Exam  Neck:  Supple, has normal full flexion and extension that is painless. She has full ear-to-shoulder on left but only about 50% of that motion on right. Shoulder shrug is normal, a little painful  TTP in trapezius muscle belly on right. No defect or atrophy of muscle noted.  Shoulder Right: Full overhead arm raise. Slight pain with supraspinatus testing  but full symmetrical strength. IR/ER 5/5 painless   Impression & Recommendations:  Problem # 1:  NECK PAIN (ICD-723.1) She did so well with shoulder pT I think she will benefit from neck pT. RTC as needed, PT referral made  Problem # 2:  SACROILIAC JOINT DYSFUNCTION (ICD-724.6) greatly improved after her injection although she did have a steroid flair--it is now resolved  Complete Medication List: 1)  Nexium 20 Mg Cpdr (Esomeprazole magnesium) .... Take one capsule daily. 2)  Albuterol Sulfate Nebu (Albuterol sulfate nebu) .... Inhale 2 puffs every 4-6hours as needed 3)  Metoprolol Succinate 25 Mg Xr24h-tab (Metoprolol succinate) .... Once daily 4)  Advil 200 Mg Caps (Ibuprofen) .... 2 tabs every eight hours as needed for pain. 5)  Cyanocobalamin 1000 Mcg/ml Soln (Cyanocobalamin) .... Inject 1 ml every day for one week and then once a week for one month and then once a month. 6)  Allegra 180 Mg Tabs (Fexofenadine hcl) .... Take 1 tablet by mouth once a day 7)  Caltrate  600+d Plus 600-400 Mg-unit Tabs (Calcium carbonate-vit d-min) .... Take 1 tablet by mouth two times a day 8)  Ambien 10 Mg Tabs (Zolpidem tartrate) .... Take 1 tablet by mouth once a day 9)  Vicodin 5-500 Mg Tabs (Hydrocodone-acetaminophen) .... One by mouth q4hrs as needed pain 10)  Simvastatin 20 Mg Tabs (Simvastatin) .... Take 1 tablet by mouth once a day 11)  Aspirin Ec 81 Mg Tbec (Aspirin) .... Take 1 tablet by mouth once a day

## 2010-08-01 NOTE — Miscellaneous (Signed)
Clinical Lists Changes  Observations: Added new observation of PAST MED HX: Thrombosis     H/o DVT: L LE '85,'87,'89     H/o Pulmonary embolism:1983  Asthma     Albuterol MDI as needed  GERD       Maintained on PPI  Hyperlipidemia     Maintained on statin  HTN  Vit B 12 def     Dx and started tx 1/10     B 12 level decreased to 171 May 2011  Insomnia    Pain syndrome     OA R hip - evaluated by orth     Trochanteric bursitis s/p steroid injection     L foot pain 2/2 old fx  Seasonal allergies  Atypical CP     Myoview negative 5/11     EF 63%     (02/20/2010 19:57) Added new observation of LDL: 156 mg/dL (63/87/5643 32:95) Added new observation of HDL: 46 mg/dL (18/84/1660 63:01) Added new observation of TRIGLYC TOT: 86 mg/dL (60/04/9322 55:73) Added new observation of CHOLESTEROL: 219 mg/dL (22/08/5425 06:23) Added new observation of B12: 171 pg/mL (10/30/2009 19:57)      Past History:  Past Medical History: Thrombosis     H/o DVT: L LE '85,'87,'89     H/o Pulmonary embolism:1983  Asthma     Albuterol MDI as needed  GERD       Maintained on PPI  Hyperlipidemia     Maintained on statin  HTN  Vit B 12 def     Dx and started tx 1/10     B 12 level decreased to 171 May 2011  Insomnia    Pain syndrome     OA R hip - evaluated by orth     Trochanteric bursitis s/p steroid injection     L foot pain 2/2 old fx  Seasonal allergies  Atypical CP     Myoview negative 5/11     EF 63%  Appended Document:     Clinical Lists Changes  Observations: Added new observation of PAST MED HX: Thrombosis     H/o DVT: L LE '85,'87,'89     H/o Pulmonary embolism:1983  Asthma     Albuterol MDI as needed  GERD       Maintained on PPI  Osteopenia     DEXA 6/10 T scores lumbar spine -2.2 to -2.3  Hyperlipidemia     Maintained on statin  HTN  Vit B 12 def     Dx and started tx 1/10     B 12 level decreased to 171 May 2011  Insomnia   Pain syndrome     OA R hip - evaluated by orth     Trochanteric bursitis s/p steroid injection     L foot pain 2/2 old fx  Seasonal allergies  Atypical CP     Myoview negative 5/11     EF 63%     (02/20/2010 20:08) Added new observation of BONE DENSITY: Lumbar spine T scores -2.2 to -2.3 (11/30/2008 20:08)       Past History:  Past Medical History: Thrombosis     H/o DVT: L LE '85,'87,'89     H/o Pulmonary embolism:1983  Asthma     Albuterol MDI as needed  GERD       Maintained on PPI  Osteopenia     DEXA 6/10 T scores lumbar spine -2.2 to -2.3  Hyperlipidemia     Maintained on statin  HTN  Vit  B 12 def     Dx and started tx 1/10     B 12 level decreased to 171 May 2011  Insomnia    Pain syndrome     OA R hip - evaluated by orth     Trochanteric bursitis s/p steroid injection     L foot pain 2/2 old fx  Seasonal allergies  Atypical CP     Myoview negative 5/11     EF 63%

## 2010-08-01 NOTE — Op Note (Signed)
Summary: consent  consent   Imported By: Marily Memos 06/05/2010 15:17:48  _____________________________________________________________________  External Attachment:    Type:   Image     Comment:   External Document

## 2010-08-01 NOTE — Progress Notes (Signed)
Summary: Stomach upset  Phone Note Call from Patient Message from:  Patient on May 31, 2010 10:14 AM  Summary of Call: Pt called said that she is having vomiting and diarrhea x 2 days.  Feels nauseaous now.  Little lightheaded today.   No fevers or chills. Angelina Ok RN  May 31, 2010 10:42 AM

## 2010-08-01 NOTE — Progress Notes (Signed)
Summary: phone/gg  Phone Note Call from Patient   Caller: Patient Summary of Call: Pt called and states her insurance will not pay for allergra because it's OTC.  She is requesting another allergy med that is prescription so ins will pay for it. Pt # [id number] Initial call taken by: Merrie Roof RN,  March 28, 2010 10:11 AM  Follow-up for Phone Call        What has she tried that works and her insurance will cover? I discussed this last visit (see below) and allegra was best option.   Pt has a hx of seasonal allergies and is on allegra. However for the past several weeks she has had increased puffiness around her eyes, now L > R, rhinorrhea, sneezing, itchy eyes. She has tried Research scientist (medical) but it made her sleepy. Nasonex and any nose sprays gave her nose bleeds. Claritin didn;t help. Last year she states her eyes got this bad and she had to take an ABX to clear it up. I tried to look this up and she was on Cipro and Doxy in Sep 2010 but it ws for an otits not periorbital cellulitis. I am concerned about a cellulitis due to the asymmetry - if it was truely just allergies, it should be B. Therefore, I will try a course of ABX. PCN based ABX are recommended for orbital cellulitis but she had breathing problems previously on PCN so I will go with doxy. Also add Patanol as this helped in the past and cont with Allegra Follow-up by: Blanch Media MD,  March 28, 2010 11:10 AM  Additional Follow-up for Phone Call Additional follow up Details #1::        Not much of a selection left.  What about Loratadine 10 mg?? Additional Follow-up by: Merrie Roof RN,  March 28, 2010 1:59 PM    Additional Follow-up for Phone Call Additional follow up Details #2::    Loratidine is generic claritin which didn;t help pt. Pt willing to try nose sprays again. I sent in fluticasone. To stop and call me if gets nose bleeds.  Gets BP checked weekly at her community,. Was 153/90 today. All others OK. I  reviewed her BP here last visit and only three increased. I am OK watching BP. If remains elevated, pt to call and then I can change metoprolol to once daily.  Follow-up by: Blanch Media MD,  March 28, 2010 4:43 PM  New/Updated Medications: FLUTICASONE PROPIONATE 50 MCG/ACT SUSP (FLUTICASONE PROPIONATE) 2 sprays per nostril once daily for one week. Then one spray per nostril daily. This is for allergies. -Prescriptions: FLUTICASONE PROPIONATE 50 MCG/ACT SUSP (FLUTICASONE PROPIONATE) 2 sprays per nostril once daily for one week. Then one spray per nostril daily. This is for allergies.  #1 x 11   Entered and Authorized by:   Blanch Media MD   Signed by:   Blanch Media MD on 03/28/2010   Method used:   [facility]        [facility] [address] [phone]* (retail)       [address]       [place], [place]  [id number]       Ph: [phone] or [phone]       Fax: [phone]   RxID:   [id number]   Appended Document: phone/gg    Clinical Lists Changes  Allergies: Added new allergy or adverse reaction of * NOSE SPRAYS - INC STEROID SPRAYS

## 2010-08-01 NOTE — Assessment & Plan Note (Signed)
Summary: EST-CK/FU/MEDS/CFB   Vital Signs:  Patient profile:   65 year old female Height:      69 inches Weight:      201.8 pounds BMI:     29.91 Temp:     97.1 degrees F oral Pulse rate:   73 / minute BP sitting:   151 / 83  (right arm)  Vitals Entered By: Filomena Jungling NT II (September 05, 2009 8:36 AM) CC: check-up Is Patient Diabetic? No Pain Assessment Patient in pain? yes     Location: right shoulder and  left foot Intensity: 8 Type: aching Onset of pain  Chronic  Have you ever been in a relationship where you felt threatened, hurt or afraid?No   Does patient need assistance? Functional Status Self care Ambulation Normal   Primary Care Provider:  Elby Showers MD  CC:  check-up.  History of Present Illness: 65yof with pmh outlined in this chart here for a check up.  1) nasal polyp  2) right shoulder pain, has trouble raising it, aches all the time can not sleep on that side.  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  otherwise she is doing well with no complaints.   Preventive Screening-Counseling & Management  Alcohol-Tobacco     Alcohol drinks/day: 0     Smoking Status: never  Caffeine-Diet-Exercise     Does Patient Exercise: YES      Type of exercise: WALKING     Exercise (avg: min/session): 1 HOUR     Times/week: 7  Current Medications (verified): 1)  Nexium 20 Mg Cpdr (Esomeprazole Magnesium) .... Take One Capsule Daily. 2)  Albuterol Sulfate  Nebu (Albuterol Sulfate Nebu) .... Inhale 2 Puffs Every 4-6hours As Needed 3)  Toprol Xl 50 Mg Xr24h-Tab (Metoprolol Succinate) .... Take One Tablet At Bedtime. 4)  Advil 200 Mg Caps (Ibuprofen) .... 2 Tabs Every Eight Hours As Needed For Pain. 5)  Cyanocobalamin 1000 Mcg/ml Soln (Cyanocobalamin) .... Inject 1 Ml Every Day For One Week and Then Once A Week For One Month and Then Once A Month. 6)  Allegra 180 Mg Tabs (Fexofenadine Hcl) .... Take 1 Tablet By Mouth Once A  Day 7)  Caltrate 600+d Plus 600-400 Mg-Unit Tabs (Calcium Carbonate-Vit D-Min) .... Take 1 Tablet By Mouth Two Times A Day 8)  Ambien 10 Mg Tabs (Zolpidem Tartrate) .... Take 1 Tablet By Mouth Once A Day  Allergies (verified): 1)  ! Amoxicillin 2)  ! Aspirin 3)  ! Pcn  Family History: father had a heart surgery in his 62's  Review of Systems       per hpi  Physical Exam  General:  alert and overweight-appearing.   Head:  normocephalic and atraumatic.   Eyes:  vision grossly intact, pupils equal, pupils round, and pupils reactive to light.   Nose:  no external deformity.  there is a soft tissue mass in the left nostril that bothers her Mouth:  pharynx pink and moist.   Lungs:  normal respiratory effort and normal breath sounds.   Heart:  normal rate, regular rhythm, and no murmur.   Msk:  right shoulder tender to palpation, limited range of motion can not raise above head laterally, to the front, sensation in tact on exam but reports frequent numbness. left shoulder not tender, good strength. Pulses:  +1 Extremities:  no LE edema Neurologic:  alert & oriented X3, cranial nerves II-XII intact, and gait normal.  Skin:  no suspicious lesions.   Cervical Nodes:  no anterior cervical adenopathy and no posterior cervical adenopathy.   Psych:  Oriented X3, memory intact for recent and remote, and normally interactive.     Impression & Recommendations:  Problem # 1:  HYPERLIPIDEMIA (ICD-272.4) is working hard to [employer] and exercise.  Will increase fiber.  Labs Reviewed: SGOT: 18 (07/01/2009)   SGPT: 13 (07/01/2009)   HDL:49 (07/01/2009), 43 (06/02/2008)  LDL:150 (07/01/2009), 129 (06/02/2008)  Chol:217 (07/01/2009), 211 (06/02/2008)  Trig:92 (07/01/2009), 194 (06/02/2008)  Problem # 2:  HYPERTENSION (ICD-401.9) BP high today, looking back this is a first high value in a while.  Will not change meds at this time.  BP recheck and if 3 high readings treat.   Her updated  medication list for this problem includes:    Toprol Xl 50 Mg Xr24h-tab (Metoprolol succinate) .Marland Kitchen... Take one tablet at bedtime.  BP today: 151/83 Prior BP: 131/73 (06/29/2009)  Labs Reviewed: K+: 3.9 (07/01/2009) Creat: : 0.87 (07/01/2009)   Chol: 217 (07/01/2009)   HDL: 49 (07/01/2009)   LDL: 150 (07/01/2009)   TG: 92 (07/01/2009)  Problem # 3:  NASAL POLYP (ICD-471.0)  Bothersome soft tissue mass in the right nostril.  Sometimes occludes nostril and is painful.  Would like to have this removed.  Will refer to ENT.  Orders: ENT Referral (ENT)  Problem # 4:  SHOULDER PAIN, RIGHT, CHRONIC (ICD-719.41)  Years of right shoulder pain, tenderness, decreased ROM of the right shoulder and some numbness.  OA vs rotator cuff.  Will get xrays and refer to sports medicine.   Her updated medication list for this problem includes:    Advil 200 Mg Caps (Ibuprofen) .Marland Kitchen... 2 tabs every eight hours as needed for pain.  Orders: Diagnostic X-Ray/Fluoroscopy (Diagnostic X-Ray/Flu) Sports Medicine (Sports Med)  Problem # 5:  PREVENTIVE HEALTH CARE (ICD-V70.0) set up mam today give hemocult cards today.  Complete Medication List: 1)  Nexium 20 Mg Cpdr (Esomeprazole magnesium) .... Take one capsule daily. 2)  Albuterol Sulfate Nebu (Albuterol sulfate nebu) .... Inhale 2 puffs every 4-6hours as needed 3)  Toprol Xl 50 Mg Xr24h-tab (Metoprolol succinate) .... Take one tablet at bedtime. 4)  Advil 200 Mg Caps (Ibuprofen) .... 2 tabs every eight hours as needed for pain. 5)  Cyanocobalamin 1000 Mcg/ml Soln (Cyanocobalamin) .... Inject 1 ml every day for one week and then once a week for one month and then once a month. 6)  Allegra 180 Mg Tabs (Fexofenadine hcl) .... Take 1 tablet by mouth once a day 7)  Caltrate 600+d Plus 600-400 Mg-unit Tabs (Calcium carbonate-vit d-min) .... Take 1 tablet by mouth two times a day 8)  Ambien 10 Mg Tabs (Zolpidem tartrate) .... Take 1 tablet by mouth once a  day  Other Orders: T-Hemoccult Card-Multiple (take home) (29562) Mammogram (Screening) (Mammo)  Patient Instructions: 1)  You will be refered to an ear nose and throat surgeon about the mass in your nose. 2)  You will have xrays and be refered to Sports Medicine for your right shoulder pain. 3)  You will be scheduled for a mamogram. 4)  You will be given hemmocult cards.  Please return them to the clinic as part of screening for colon cancer. 5)  You are doing a great job with weight loss!  Keep walking every day and watching your diet!  Prevention & Chronic Care Immunizations   Influenza vaccine: Not documented    Tetanus booster: Not  documented    Pneumococcal vaccine: Not documented    H. zoster vaccine: Not documented  Colorectal Screening   Hemoccult: Not documented   Hemoccult action/deferral: Ordered  (09/05/2009)    Colonoscopy: Not documented   Colonoscopy action/deferral: Refused  (03/09/2009)  Other Screening   Pap smear: Not documented   Pap smear action/deferral: Not indicated S/P hysterectomy  (09/05/2009)    Mammogram: ASSESSMENT: Negative - BI-RADS 1^MM DIGITAL SCREENING  (07/15/2008)   Mammogram action/deferral: Ordered  (09/05/2009)    DXA bone density scan: Not documented   DXA scan due: 12/2010    Smoking status: never  (09/05/2009)  Lipids   Total Cholesterol: 217  (07/01/2009)   Lipid panel action/deferral: Refused   LDL: 150  (07/01/2009)   LDL Direct: Not documented   HDL: 49  (07/01/2009)   Triglycerides: 92  (07/01/2009)    SGOT (AST): 18  (07/01/2009)   BMP action: Refused   SGPT (ALT): 13  (07/01/2009)   Alkaline phosphatase: 102  (07/01/2009)   Total bilirubin: 0.5  (07/01/2009)    Lipid flowsheet reviewed?: Yes   Progress toward LDL goal: Unchanged  Hypertension   Last Blood Pressure: 151 / 83  (09/05/2009)   Serum creatinine: 0.87  (07/01/2009)   Serum potassium 3.9  (07/01/2009)  Self-Management Support :    Patient  will work on the following items until the next clinic visit to reach self-care goals:     Medications and monitoring: take my medicines every day, bring all of my medications to every visit  (09/05/2009)     Eating: drink diet soda or water instead of juice or soda, eat more vegetables, use fresh or frozen vegetables, eat foods that are low in salt, eat baked foods instead of fried foods, eat fruit for snacks and desserts  (09/05/2009)     Activity: take a 30 minute walk every day, take the stairs instead of the elevator  (09/05/2009)    Hypertension self-management support: Resources for patients handout  (09/05/2009)    Lipid self-management support: Education handout, Resources for patients handout  (09/05/2009)     Lipid education handout printed      Resource handout printed.   Nursing Instructions: Schedule screening mammogram (see order) Provide Hemoccult cards with instructions (see order)

## 2010-08-01 NOTE — Progress Notes (Signed)
  Phone Note Outgoing Call   Call placed by: Theotis Barrio NT II,  February 28, 2010 1:53 PM Call placed to: Patient Details for Reason: REFERRAL FOR DERMATOLOGY Summary of Call: UNABLE TO REACH PATIENT BY PHONE- [phone] NUMBER IS DC'D / SECOND PHONE NUMBER NO ANSWER. WILL TRY LATER. UNABLE TO MAKE APPT WITH [facility]- SHE NEEDS TO CALL THE OFFICE $$. Lela Sturdivant NT II  February 28, 2010 1:55 PM

## 2010-08-01 NOTE — Assessment & Plan Note (Signed)
Summary: R HIP PAIN,MC   Vital Signs:  Patient profile:   65 year old female BP sitting:   113 / 64  Vitals Entered By: Lillia Pauls CMA (Nov 04, 2009 10:35 AM)  Primary Provider:  Elby Showers MD   History of Present Illness: Patient enters c/o worse hip pain This flared p injection that evening states started at SIJ area aned ran down ant thigh to side of foot shrpest pain is now resolving  area on lat hip is tender but no radiating pain from there  first day hurt to stand or walk  comes for reck and wants something to help w pain rarely takes anything stronger than advil  Allergies: 1)  ! Amoxicillin 2)  ! Aspirin 3)  ! Pcn  Physical Exam  General:  Well-developed,well-nourished,in no acute distress; alert,appropriate and cooperative throughout examination Msk:  Has good ROM of both hips however feels RT SIJ pain with motion c/o TTP over RT SIJ no redness no swelling area over greater troch is not reddened either and is not as TTP  good strength and normal gait   Impression & Recommendations:  Problem # 1:  ADVERSE DRUG REACTION (ICD-995.20) I suspect she had a steroid flare or possibly a reaction to anesthetic medicine This seemed primarily from SIJ injection but pattern was consistent  will give Vicodin for 7 days as needed OK to cont w PT as tolerated may use pain med before PT  Problem # 2:  SACROILIAC JOINT DYSFUNCTION (ICD-724.6) This is still symptoimatic so hopefully injection may still help cont PT  Problem # 3:  TROCHANTERIC BURSITIS (ICD-726.5) pending result of injection here  she is to keep her f/u as scheduled unless pain does not resolve w meds  Complete Medication List: 1)  Nexium 20 Mg Cpdr (Esomeprazole magnesium) .... Take one capsule daily. 2)  Albuterol Sulfate Nebu (Albuterol sulfate nebu) .... Inhale 2 puffs every 4-6hours as needed 3)  Toprol Xl 50 Mg Xr24h-tab (Metoprolol succinate) .... Take one tablet at bedtime. 4)   Advil 200 Mg Caps (Ibuprofen) .... 2 tabs every eight hours as needed for pain. 5)  Cyanocobalamin 1000 Mcg/ml Soln (Cyanocobalamin) .... Inject 1 ml every day for one week and then once a week for one month and then once a month. 6)  Allegra 180 Mg Tabs (Fexofenadine hcl) .... Take 1 tablet by mouth once a day 7)  Caltrate 600+d Plus 600-400 Mg-unit Tabs (Calcium carbonate-vit d-min) .... Take 1 tablet by mouth two times a day 8)  Ambien 10 Mg Tabs (Zolpidem tartrate) .... Take 1 tablet by mouth once a day 9)  Vicodin 5-500 Mg Tabs (Hydrocodone-acetaminophen) .... One by mouth q4hrs as needed pain Prescriptions: VICODIN 5-500 MG TABS (HYDROCODONE-ACETAMINOPHEN) one by mouth q4hrs as needed pain  #30 x 0   Entered by:   Lillia Pauls CMA   Authorized by:   Enid Baas MD   Signed by:   Lillia Pauls CMA on 11/04/2009   Method used:   Print then Give to Patient   RxID:   [id number]

## 2010-08-01 NOTE — Miscellaneous (Signed)
Summary: [facility]  [facility]   Imported By: Marily Memos 11/08/2009 16:13:16  _____________________________________________________________________  External Attachment:    Type:   Image     Comment:   External Document

## 2010-08-01 NOTE — Assessment & Plan Note (Signed)
Summary: ACUTE-STOMACH UPSET/VOMITING/DIARRHEA ADD PER GLAYDS(BUTCHER)   Vital Signs:  Patient profile:   65 year old female Height:      69 inches (175.26 cm) Weight:      206.0 pounds (93.64 kg) BMI:     30.53 Temp:     96.8 degrees F (36 degrees C) oral Pulse rate:   68 / minute BP sitting:   131 / 76  (right arm) Cuff size:   regular  Vitals Entered By: Cynda Familia Duncan Dull) (May 31, 2010 11:27 AM) CC: pt c/o diarrheax 2 days, last loose stool was yesterday around 3pm, awoke  this am to stomach pain Is Patient Diabetic? No Pain Assessment Patient in pain? yes     Location: stomach Intensity: 9 Type: aching Onset of pain  Intermittent-started this am  Nutritional Status BMI of > 30 = obese  Have you ever been in a relationship where you felt threatened, hurt or afraid?No   Does patient need assistance? Functional Status Self care Ambulation Normal   Primary Care Provider:  Blanch Media MD  CC:  pt c/o diarrheax 2 days, last loose stool was yesterday around 3pm, and awoke  this am to stomach pain.  History of Present Illness: Jessica Conrad is a 65yo W with HTN, HL, chronic pain who presents with diarrhea since Monday. Last week, he was in [place] celebrating Thanksgiving with Family. She arrived home on Saturday, was feeling well, ate some Thanksgiving leftovers on Sunday. Awoke early Monday night with diarrhea. She had 4-6 episodes of yellow, loose diarrhea without gross blood on Monday. Diarrhea continued through yesterday afternoon. She took about three doses of Imodium yesterday and last BM was 3pm yesterday. Denies fever, vomiting, or sick contacts. She was able to eat fairly normally until this morning when she felt slightly nauseated and developed midepigastric pain. She has been able to tolerate fluids this morning (water, coffee, and juice). Urination is normal; she has been drinking well and does not feel dehydrated.   Depression History:      The  patient denies a depressed mood most of the day and a diminished interest in her usual daily activities.         Preventive Screening-Counseling & Management  Alcohol-Tobacco     Alcohol drinks/day: 0     Smoking Status: never  Current Medications (verified): 1)  Nexium 20 Mg Cpdr (Esomeprazole Magnesium) .... Take One Capsule Daily. 2)  Proventil Hfa 108 (90 Base) Mcg/act Aers (Albuterol Sulfate) .... 2 Puffs Every Four Hours As Needed For Shortness of Breath. 3)  Metoprolol Succinate 25 Mg Xr24h-Tab (Metoprolol Succinate) .... Take 1 Tablet By Mouth Once A Day 4)  Cyanocobalamin 1000 Mcg/ml Soln (Cyanocobalamin) .... Inject 1 Ml Every Day For One Week and Then Once A Week For One Month and Then Once A Month. 5)  Caltrate 600+d Plus 600-400 Mg-Unit Tabs (Calcium Carbonate-Vit D-Min) .... Take 1 Tablet By Mouth Two Times A Day 6)  Ambien 10 Mg Tabs (Zolpidem Tartrate) .... Take 1 Tablet By Mouth At Bedtime As Needed For Sleeping Problems 7)  Simvastatin 20 Mg Tabs (Simvastatin) .... Take 1 Tablet By Mouth At Bedtime For Cholesterol 8)  Aspirin Ec 81 Mg Tbec (Aspirin) .... Take 1 Tablet By Mouth Once A Day 9)  Patanol 0.1 % Soln (Olopatadine Hcl) .Marland Kitchen.. 1 Drop Into Both Eyes Two Times A Day  For Allergies  Allergies: 1)  ! Amoxicillin 2)  ! Aspirin 3)  ! Pcn 4)  ! 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,'87,'89     H/o Pulmonary embolism:1983  Asthma     Albuterol MDI as needed  GERD       Maintained on PPI  Osteopenia     DEXA 6/10 T scores lumbar spine -2.2 to -2.3  Hyperlipidemia     Maintained on statin  HTN  Vit B 12 def     Dx and started tx 1/10     B 12 level decreased to 171 May 2011  Insomnia    Pain syndrome     OA R hip - evaluated by orth     Trochanteric bursitis s/p steroid injection     L foot pain 2/2 old fx  Seasonal allergies  Atypical CP     Myoview  negative 5/11     EF 63%  Past Surgical History: Last updated: 05/14/2006 S/p Appendectomy S/p Hysterectomy-partial S/p Oophorectomy S/p Sinus surgery S/p Tonsillectomy  Family History: Last updated: 09/05/2009 father had a heart surgery in his 71's  Social History: Last updated: 07/06/2008 Retired Copywriter, 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 ENT:  Denies postnasal drainage and sore throat. CV:  Denies chest pain or discomfort. Resp:  Denies cough and shortness of breath. GI:  Complains of abdominal pain, diarrhea, loss of appetite, and nausea; denies bloody stools, dark tarry stools, and vomiting. GU:  Denies dysuria, incontinence, urinary frequency, and urinary hesitancy.  Physical Exam  General:  alert and cooperative to examination.   Head:  normocephalic and atraumatic.   Eyes:  pupils equal, pupils round, and pupils reactive to light.   Mouth:  pharynx pink and moist.   Neck:  supple and no masses.   Lungs:  normal respiratory effort, normal breath sounds, no crackles, and no wheezes.   Heart:  normal rate, regular rhythm, no murmur, no gallop, and no rub.   Abdomen:  soft, non-tender, normal bowel sounds, no distention, no masses, no guarding, no rigidity, and no rebound tenderness.   Rectal:  no external abnormalities and normal sphincter tone.  Minimal yellow-brown stool observed on digital rectal exam. fecal occult blood negative.  Extremities:  No cyanosis, clubbing, or edema.  Neurologic:  alert & oriented X3. Cranial nerves grossly intact. Strength/sensation normal.  Skin:  turgor normal and no rashes.   Psych:  Oriented X3, memory intact for recent and remote, normally interactive, good eye contact, not anxious appearing, and not depressed appearing.     Impression & Recommendations:  Problem # 1:  DIARRHEA  (ICD-787.91) Assessment New Most likely self-limited due to either a virus or perhaps contaminated food. Abdominal and rectal exams were completely normal; therefore, suspicion for more concerning etiology is very low. She is counseled to continue hydration, gentle nutrition (avoid milk, cheese, and other difficult to digest foods), and Imodium as needed for symptom control. She was also instructed to call the clinic if her symptoms worsen or fail to improve over the next few days.   Problem # 2:  INSOMNIA (ICD-780.52) Assessment: Comment Only Patient is not yet out of Ambien but noted that she may need a refill sent to her mail order pharmacy soon. She reported frequently taking a second 10mg  tablet of Ambien at  night if she still has trouble sleeping. She was counseled that 10mg  is the maximum dose and that she should not use a second dose. She was advised to take Ambien on an empty stomach, as the absorption should be better (she often takes it with a bedtime snack).   Her updated medication list for this problem includes:    Ambien 10 Mg Tabs (Zolpidem tartrate) .Marland Kitchen... Take 1 tablet by mouth at bedtime as needed for sleeping problems  Problem # 3:  HYPERTENSION (ICD-401.9) Assessment: Unchanged Continue current management.   Her updated medication list for this problem includes:    Metoprolol Succinate 25 Mg Xr24h-tab (Metoprolol succinate) .Marland Kitchen... Take 1 tablet by mouth once a day  Problem # 4:  VITAMIN B12 DEFICIENCY (ICD-266.2) Regular B12 shot was administered.   Orders: Admin of Therapeutic Inj  intramuscular or subcutaneous ([address]) Vit B12 1000 mcg (J3420)  Complete Medication List: 1)  Nexium 20 Mg Cpdr (Esomeprazole magnesium) .... Take one capsule daily. 2)  Proventil Hfa 108 (90 Base) Mcg/act Aers (Albuterol sulfate) .... 2 puffs every four hours as needed for shortness of breath. 3)  Metoprolol Succinate 25 Mg Xr24h-tab (Metoprolol succinate) .... Take 1 tablet by mouth once a  day 4)  Cyanocobalamin 1000 Mcg/ml Soln (Cyanocobalamin) .... Inject 1 ml every day for one week and then once a week for one month and then once a month. 5)  Caltrate 600+d Plus 600-400 Mg-unit Tabs (Calcium carbonate-vit d-min) .... Take 1 tablet by mouth two times a day 6)  Ambien 10 Mg Tabs (Zolpidem tartrate) .... Take 1 tablet by mouth at bedtime as needed for sleeping problems 7)  Simvastatin 20 Mg Tabs (Simvastatin) .... Take 1 tablet by mouth at bedtime for cholesterol 8)  Aspirin Ec 81 Mg Tbec (Aspirin) .... Take 1 tablet by mouth once a day 9)  Patanol 0.1 % Soln (Olopatadine hcl) .Marland Kitchen.. 1 drop into both eyes two times a day  for allergies  Patient Instructions: 1)  Please call our clinic if your symptoms worsen or go to the emergency room.    Medication Administration  Injection # 1:    Medication: Vit B12 1000 mcg    Diagnosis: VITAMIN B12 DEFICIENCY (ICD-266.2)    Route: IM    Site: L deltoid    Exp Date: 10/2011    Lot #: [id number]    Mfr: [employer]    Patient tolerated injection without complications    Given by: Stanton Kidney Ditzler RN (May 31, 2010 12:36 PM)  Orders Added: 1)  Admin of Therapeutic Inj  intramuscular or subcutaneous [96372] 2)  Vit B12 1000 mcg [J3420] 3)  Est. Patient Level IV [81191]    Prevention & Chronic Care Immunizations   Influenza vaccine: Not documented   Influenza vaccine deferral: Not indicated  (04/27/2010)    Tetanus booster: Not documented   Td booster deferral: Not indicated  (04/27/2010)    Pneumococcal vaccine: Not documented   Pneumococcal vaccine deferral: Deferred  (04/27/2010)    H. zoster vaccine: Not documented   H. zoster vaccine deferral: Deferred  (04/27/2010)  Colorectal Screening   Hemoccult: Not documented   Hemoccult action/deferral: Ordered  (09/05/2009)    Colonoscopy: Not documented   Colonoscopy action/deferral: Refused  (03/09/2009)  Other Screening   Pap smear: Not documented   Pap  smear action/deferral: Not indicated S/P hysterectomy  (09/05/2009)    Mammogram: ASSESSMENT: Negative - BI-RADS 1^MM DIGITAL SCREENING  (09/08/2009)   Mammogram action/deferral: Ordered  (  09/05/2009)    DXA bone density scan: Lumbar spine T scores -2.2 to -2.3  (11/30/2008)   DXA scan due: 12/2010    Smoking status: never  (05/31/2010)  Lipids   Total Cholesterol: 219  (11/29/2009)   Lipid panel action/deferral: Refused   LDL: 156  (11/29/2009)   LDL Direct: Not documented   HDL: 46  (11/29/2009)   Triglycerides: 86  (11/29/2009)    SGOT (AST): 18  (07/01/2009)   BMP action: Refused   SGPT (ALT): 13  (07/01/2009)   Alkaline phosphatase: 102  (07/01/2009)   Total bilirubin: 0.5  (07/01/2009)    Lipid flowsheet reviewed?: Yes   Progress toward LDL goal: Unchanged  Hypertension   Last Blood Pressure: 131 / 76  (05/31/2010)   Serum creatinine: 0.87  (07/01/2009)   Serum potassium 3.9  (07/01/2009)    Hypertension flowsheet reviewed?: Yes   Progress toward BP goal: Unchanged  Self-Management Support :   Personal Goals (by the next clinic visit) :      Personal blood pressure goal: 130/80  (12/13/2009)   Patient will work on the following items until the next clinic visit to reach self-care goals:     Medications and monitoring: take my medicines every day  (05/31/2010)     Eating: eat foods that are low in salt, eat baked foods instead of fried foods  (05/31/2010)     Activity: take a 30 minute walk every day, take the stairs instead of the elevator  (04/27/2010)    Hypertension self-management support: Written self-care plan  (05/31/2010)   Hypertension self-care plan printed.    Lipid self-management support: Written self-care plan  (05/31/2010)   Lipid self-care plan printed.    Medication Administration  Injection # 1:    Medication: Vit B12 1000 mcg    Diagnosis: VITAMIN B12 DEFICIENCY (ICD-266.2)    Route: IM    Site: L deltoid    Exp Date: 10/2011     Lot #: [id number]    Mfr: [employer]    Patient tolerated injection without complications    Given by: Stanton Kidney Ditzler RN (May 31, 2010 12:36 PM)  Orders Added: 1)  Admin of Therapeutic Inj  intramuscular or subcutaneous [96372] 2)  Vit B12 1000 mcg [J3420] 3)  Est. Patient Level IV [78469]  Appended Document: ACUTE-STOMACH UPSET/VOMITING/DIARRHEA ADD PER GLAYDS(BUTCHER) I discussed the patient with Dr. Odis Luster and I agree with the assessment and plan as outlined above.

## 2010-08-01 NOTE — Progress Notes (Signed)
Summary: med refill/gp  Phone Note Refill Request Message from:  Patient on August 12, 2009 11:22 AM  Refills Requested: Medication #1:  AMBIEN 10 MG TABS Take 1 tablet by mouth once a day. Needs 90 day supply.  Pt. wants Rx faxed to [facility] [phone].  She stated she can get 90 day supply [facility] the [facility].   Method Requested: Fax to Local Pharmacy Initial call taken by: Chinita Pester RN,  August 12, 2009 11:23 AM    Prescriptions: AMBIEN 10 MG TABS (ZOLPIDEM TARTRATE) Take 1 tablet by mouth once a day  #90 x 0   Entered and Authorized by:   Elby Showers MD   Signed by:   Elby Showers MD on 08/12/2009   Method used:   Print then Give to Patient   RxID:   [id number]   Appended Document: med refill/gp Above Rx refill request faxed to [facility].

## 2010-08-01 NOTE — Assessment & Plan Note (Signed)
Summary: MONTHLY B12 INJ/CH  Nurse Visit   Allergies: 1)  ! Amoxicillin 2)  ! Aspirin 3)  ! Pcn  Medication Administration  Injection # 1:    Medication: Vit B12 1000 mcg    Diagnosis: VITAMIN B12 DEFICIENCY (ICD-266.2)    Route: IM    Site: R deltoid    Exp Date: 08/2011    Lot #: [id number]    Mfr: [employer]    Patient tolerated injection without complications    Given by: Chinita Pester RN (January 12, 2010 9:30 AM)  Orders Added: 1)  Admin of Therapeutic Inj  intramuscular or subcutaneous [96372] 2)  Vit B12 1000 mcg [J3420]

## 2010-08-01 NOTE — Progress Notes (Signed)
Summary: med clarification/gp  Phone Note Refill Request Message from:  Fax from Pharmacy on February 01, 2010 1:43 PM  Refills Requested: Medication #1:  ALBUTEROL SULFATE  NEBU Inhale 2 puffs every 4-6hours as needed Please clarify nebulizer or inhaler. Thanks  Initial call taken by: Chinita Pester RN,  February 01, 2010 1:43 PM  Follow-up for Phone Call        I have enver seen pt. Reviewed D/C summary. Appears she is on MDI. Tried to call pt - machine. Follow-up by: Blanch Media MD,  February 01, 2010 4:50 PM    New/Updated Medications: PROVENTIL HFA 108 (90 BASE) MCG/ACT AERS (ALBUTEROL SULFATE) 2 puffs every four hours as needed for shortness of breath. Prescriptions: PROVENTIL HFA 108 (90 BASE) MCG/ACT AERS (ALBUTEROL SULFATE) 2 puffs every four hours as needed for shortness of breath.  #1 x 3   Entered and Authorized by:   Blanch Media MD   Signed by:   Blanch Media MD on 02/01/2010   Method used:   Electronically to        [facility]* (mail-order)             , [place]         Ph: [phone]       Fax: [phone]   RxID:   [id number]

## 2010-08-01 NOTE — Progress Notes (Signed)
  Phone Note Outgoing Call   Call placed by: Blanch Media MD,  February 02, 2010 8:57 AM Call placed to: Patient Summary of Call: Called pt to check on Albuterol. Pt stated need [facility] ([facility]) and Ambien (I sent 3 month supply on 7/28 to [facility]).  I sent nexium e refill. Would you please call in Ambien. Initial call taken by: Blanch Media MD,  February 02, 2010 9:01 AM  Follow-up for Phone Call        Ambien Rx called to [facility].  Pt. was called and states she need refill on Albuterol inhaler. Follow-up by: Chinita Pester RN,  February 02, 2010 9:15 AM  Additional Follow-up for Phone Call Additional follow up Details #1::        I filled the Alb last PM and told the pt that when I called her this AM.    Prescriptions: AMBIEN 10 MG TABS (ZOLPIDEM TARTRATE) Take 1 tablet by mouth once a day  #90 x 1   Entered and Authorized by:   Blanch Media MD   Signed by:   Blanch Media MD on 02/02/2010   Method used:   Telephoned to ...       [facility]* (mail-order)             , [place]         Ph: [phone]       Fax: [phone]   RxID:   [id number] NEXIUM 20 MG CPDR (ESOMEPRAZOLE MAGNESIUM) Take one capsule daily.  #90 x 3   Entered and Authorized by:   Blanch Media MD   Signed by:   Blanch Media MD on 02/02/2010   Method used:   Electronically to        [facility]* (mail-order)             , [place]         Ph: [phone]       Fax: [phone]   RxID:   [id number]
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
## 2010-08-01 NOTE — Assessment & Plan Note (Signed)
Summary: BAD HEADACHES/SB.   Vital Signs:  Patient profile:   65 year old female Height:      69 inches (175.26 cm) Weight:      206.6 pounds (93.91 kg) BMI:     30.62 Temp:     97.4 degrees F (36.33 degrees C) oral Pulse rate:   72 / minute BP sitting:   136 / 78  (right arm)  Vitals Entered By: Stanton Kidney Ditzler RN (April 27, 2010 2:47 PM) Is Patient Diabetic? No Pain Assessment Patient in pain? yes     Location: left foot and leg Intensity: 7-10 Type: grabbing Onset of pain  long time Nutritional Status BMI of > 30 = obese Nutritional Status Detail appetite down  Have you ever been in a relationship where you felt threatened, hurt or afraid?denies   Does patient need assistance? Functional Status Self care Ambulation Normal Comments Discuss BP on 10/25 155/96 -pulse 64 and 03/28/10 153/90 pulse 71. Discuss pain left leg and foot. Dr Terri Piedra removed cyst 1 month ago still sore.   Primary Care Stefany Starace:  Blanch Media MD   History of Present Illness: 65 yo female with PMH outlined below presents to [facility] with main concern of BP follow up. She was told in septemer that her BP was high and was running in 150's/100's (one person in her living facility told her that) and she wants to have it checked out. SHe has no other concerns at the time. Tells me about her diet and how she likes to eat vegeatbles and fruits and does not like meat. She wants to know if that is OK and if she needs to do something else. She reports compliance with her medications.   Depression History:      The patient denies a depressed mood most of the day and a diminished interest in her usual daily activities.  The patient denies significant weight loss, significant weight gain, insomnia, hypersomnia, psychomotor agitation, psychomotor retardation, fatigue (loss of energy), feelings of worthlessness (guilt), impaired concentration (indecisiveness), and recurrent thoughts of death or suicide.        The  patient denies that she feels like life is not worth living, denies that she wishes that she were dead, and denies that she has thought about ending her life.         Preventive Screening-Counseling & Management  Alcohol-Tobacco     Alcohol drinks/day: 0     Smoking Status: never  Caffeine-Diet-Exercise     Does Patient Exercise: YES      Type of exercise: WALKING     Exercise (avg: min/session): 1 HOUR     Times/week: 7  Problems Prior to Update: 1)  Chronic Pain Syndrome  (ICD-338.4) 2)  Hx of Other Chest Pain  (ICD-786.59) 3)  Insomnia  (ICD-780.52) 4)  Inadequate Material Resources  (ICD-V60.2) 5)  Nasal Polyp  (ICD-471.0) 6)  Hypertension  (ICD-401.9) 7)  Osteopenia  (ICD-733.90) 8)  Palpitations  (ICD-785.1) 9)  Vitamin B12 Deficiency  (ICD-266.2) 10)  Preventive Health Care  (ICD-V70.0) 11)  Hyperlipidemia  (ICD-272.4) 12)  Allergic Rhinitis Cause Unspecified  (ICD-477.9) 13)  Leg Pain, Left  (ICD-729.5) 14)  Gerd  (ICD-530.81) 15)  Asthma  (ICD-493.90) 16)  Pulmonary Embolism, Hx of  (ICD-V12.51) 17)  Dvt, Hx of  (ICD-V12.51)  Medications Prior to Update: 1)  Nexium 20 Mg Cpdr (Esomeprazole Magnesium) .... Take One Capsule Daily. 2)  Proventil Hfa 108 (90 Base) Mcg/act Aers (Albuterol Sulfate) .... 2  Puffs Every Four Hours As Needed For Shortness of Breath. 3)  Metoprolol Succinate 25 Mg Xr24h-Tab (Metoprolol Succinate) .... Take 1 Tablet By Mouth Once A Day As Needed For Heart Palpitations. 4)  Cyanocobalamin 1000 Mcg/ml Soln (Cyanocobalamin) .... Inject 1 Ml Every Day For One Week and Then Once A Week For One Month and Then Once A Month. 5)  Caltrate 600+d Plus 600-400 Mg-Unit Tabs (Calcium Carbonate-Vit D-Min) .... Take 1 Tablet By Mouth Two Times A Day 6)  Ambien 10 Mg Tabs (Zolpidem Tartrate) .... Take 1 Tablet By Mouth At Bedtime As Needed For Sleeping Problems 7)  Simvastatin 20 Mg Tabs (Simvastatin) .... Take 1 Tablet By Mouth At Bedtime For Cholesterol 8)   Aspirin Ec 81 Mg Tbec (Aspirin) .... Take 1 Tablet By Mouth Once A Day 9)  Patanol 0.1 % Soln (Olopatadine Hcl) .Marland Kitchen.. 1 Drop Into Both Eyes Two Times A Day  For Allergies  Current Medications (verified): 1)  Nexium 20 Mg Cpdr (Esomeprazole Magnesium) .... Take One Capsule Daily. 2)  Proventil Hfa 108 (90 Base) Mcg/act Aers (Albuterol Sulfate) .... 2 Puffs Every Four Hours As Needed For Shortness of Breath. 3)  Metoprolol Succinate 25 Mg Xr24h-Tab (Metoprolol Succinate) .... Take 1 Tablet By Mouth Once A Day As Needed For Heart Palpitations. 4)  Cyanocobalamin 1000 Mcg/ml Soln (Cyanocobalamin) .... Inject 1 Ml Every Day For One Week and Then Once A Week For One Month and Then Once A Month. 5)  Caltrate 600+d Plus 600-400 Mg-Unit Tabs (Calcium Carbonate-Vit D-Min) .... Take 1 Tablet By Mouth Two Times A Day 6)  Ambien 10 Mg Tabs (Zolpidem Tartrate) .... Take 1 Tablet By Mouth At Bedtime As Needed For Sleeping Problems 7)  Simvastatin 20 Mg Tabs (Simvastatin) .... Take 1 Tablet By Mouth At Bedtime For Cholesterol 8)  Aspirin Ec 81 Mg Tbec (Aspirin) .... Take 1 Tablet By Mouth Once A Day 9)  Patanol 0.1 % Soln (Olopatadine Hcl) .Marland Kitchen.. 1 Drop Into Both Eyes Two Times A Day  For Allergies  Allergies: 1)  ! Amoxicillin 2)  ! Aspirin 3)  ! Pcn 4)  ! 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,'87,'89     H/o Pulmonary embolism:1983  Asthma     Albuterol MDI as needed  GERD       Maintained on PPI  Osteopenia     DEXA 6/10 T scores lumbar spine -2.2 to -2.3  Hyperlipidemia     Maintained on statin  HTN  Vit B 12 def     Dx and started tx 1/10     B 12 level decreased to 171 May 2011  Insomnia    Pain syndrome     OA R hip - evaluated by orth     Trochanteric bursitis s/p steroid injection     L foot pain 2/2 old fx  Seasonal allergies  Atypical CP     Myoview negative 5/11     EF  63%  Past Surgical History: Last updated: 05/14/2006 S/p Appendectomy S/p Hysterectomy-partial S/p Oophorectomy S/p Sinus surgery S/p Tonsillectomy  Family History: Last updated: 09/05/2009 father had a heart surgery in his 28's  Social History: Last updated: 07/06/2008 Retired Copywriter, 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 father had a heart surgery in his 77's  Social History: Reviewed history from 07/06/2008 and no changes required. 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 Lungs are clear to auscultation, no crackles or wheezes. Heart:  Normal rate and regular rhythm. S1 and S2 normal without gallop, murmur, click, rub or other extra sounds. Abdomen:  soft, non-tender, and normal bowel sounds.   Psych:  Oriented X3, normally interactive, good eye contact, not anxious appearing, and not depressed appearing.     Impression & Recommendations:  Problem # 1:  HYPERTENSION (ICD-401.9) Good control of BP, she is taking one tablet daily and previously was noted she takes it as needed but she really takes it once daily. No need to change the regimen at this time but if BP becomes a problem she will need some other agent and preferably HCTZ or ACEI if not contraindicated. No need for labs at this time.  Her updated medication list for this problem includes:    Metoprolol Succinate 25 Mg Xr24h-tab (Metoprolol succinate) .Marland Kitchen... Take 1 tablet by mouth once a day  BP  today: 136/78 Prior BP: 130/90 (04/20/2010)  Labs Reviewed: K+: 3.9 (07/01/2009) Creat: : 0.87 (07/01/2009)   Chol: 219 (11/29/2009)   HDL: 46 (11/29/2009)   LDL: 156 (11/29/2009)   TG: 86 (11/29/2009)  Complete Medication List: 1)  Nexium 20 Mg Cpdr (Esomeprazole magnesium) .... Take one capsule daily. 2)  Proventil Hfa 108 (90 Base) Mcg/act Aers (Albuterol sulfate) .... 2 puffs every four hours as needed for shortness of breath. 3)  Metoprolol Succinate 25 Mg Xr24h-tab (Metoprolol succinate) .... Take 1 tablet by mouth once a day 4)  Cyanocobalamin 1000 Mcg/ml Soln (Cyanocobalamin) .... Inject 1 ml every day for one week and then once a week for one month and then once a month. 5)  Caltrate 600+d Plus 600-400 Mg-unit Tabs (Calcium carbonate-vit d-min) .... Take 1 tablet by mouth two times a day 6)  Ambien 10 Mg Tabs (Zolpidem tartrate) .... Take 1 tablet by mouth at bedtime as needed for sleeping problems 7)  Simvastatin 20 Mg Tabs (Simvastatin) .... Take 1 tablet by mouth at bedtime for cholesterol 8)  Aspirin Ec 81 Mg Tbec (Aspirin) .... Take 1 tablet by mouth once a day 9)  Patanol 0.1 % Soln (Olopatadine hcl) .Marland Kitchen.. 1 drop into both eyes two times a day  for allergies  Patient Instructions: 1)  Please schedule a follow-up appointment in 1 month. 2)  Please check your blood pressure regularly, if it is >170 please call clinic at [phone] Prescriptions: METOPROLOL SUCCINATE 25 MG XR24H-TAB (METOPROLOL SUCCINATE) Take 1 tablet by mouth once a day  #30 x 3   Entered and Authorized by:   Mliss Sax MD   Signed by:   Mliss Sax MD on 04/27/2010   Method used:   Electronically to        [facility] [address] [phone]* (retail)       [id number]       [place], [place]  [address]       Ph: [phone] or [phone]       Fax: [phone]  RxID:   [id number]    Orders Added: 1)  Est. Patient Level II [14782]     Prevention & Chronic  Care Immunizations   Influenza vaccine: Not documented   Influenza vaccine deferral: Not indicated  (04/27/2010)    Tetanus booster: Not documented   Td booster deferral: Not indicated  (04/27/2010)    Pneumococcal vaccine: Not documented   Pneumococcal vaccine deferral: Deferred  (04/27/2010)    H. zoster vaccine: Not documented   H. zoster vaccine deferral: Deferred  (04/27/2010)  Colorectal Screening   Hemoccult: Not documented   Hemoccult action/deferral: Ordered  (09/05/2009)    Colonoscopy: Not documented   Colonoscopy action/deferral: Refused  (03/09/2009)  Other Screening   Pap smear: Not documented   Pap smear action/deferral: Not indicated S/P hysterectomy  (09/05/2009)    Mammogram: ASSESSMENT: Negative - BI-RADS 1^MM DIGITAL SCREENING  (09/08/2009)   Mammogram action/deferral: Ordered  (09/05/2009)    DXA bone density scan: Lumbar spine T scores -2.2 to -2.3  (11/30/2008)   DXA scan due: 12/2010    Smoking status: never  (04/27/2010)  Lipids   Total Cholesterol: 219  (11/29/2009)   Lipid panel action/deferral: Refused   LDL: 156  (11/29/2009)   LDL Direct: Not documented   HDL: 46  (11/29/2009)   Triglycerides: 86  (11/29/2009)    SGOT (AST): 18  (07/01/2009)   BMP action: Refused   SGPT (ALT): 13  (07/01/2009)   Alkaline phosphatase: 102  (07/01/2009)   Total bilirubin: 0.5  (07/01/2009)    Lipid flowsheet reviewed?: Yes   Progress toward LDL goal: Unchanged  Hypertension   Last Blood Pressure: 136 / 78  (04/27/2010)   Serum creatinine: 0.87  (07/01/2009)   Serum potassium 3.9  (07/01/2009)    Hypertension flowsheet reviewed?: Yes   Progress toward BP goal: Unchanged  Self-Management Support :   Personal Goals (by the next clinic visit) :      Personal blood pressure goal: 130/80  (12/13/2009)   Patient will work on the following items until the next clinic visit to reach self-care goals:     Medications and monitoring: take my medicines  every day, check my blood pressure, weigh myself weekly  (04/27/2010)     Eating: drink diet soda or water instead of juice or soda, eat more vegetables, use fresh or frozen vegetables, eat foods that are low in salt, eat baked foods instead of fried foods, eat fruit for snacks and desserts  (04/27/2010)     Activity: take a 30 minute walk every day, take the stairs instead of the elevator  (04/27/2010)    Hypertension self-management support: Written self-care plan, Education handout, Resources for patients handout  (04/27/2010)   Hypertension self-care plan printed.   Hypertension education handout printed    Lipid self-management support: Written self-care plan, Education handout, Resources for patients handout  (04/27/2010)   Lipid self-care plan printed.   Lipid education handout printed      Resource handout printed.
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
## 2010-08-01 NOTE — Miscellaneous (Signed)
Summary: [facility]  [facility]   Imported By: Marily Memos 01/10/2010 11:44:29  _____________________________________________________________________  External Attachment:    Type:   Image     Comment:   External Document

## 2010-08-01 NOTE — Progress Notes (Signed)
Summary: refills/ hla  Phone Note Refill Request Message from:  Patient on April 11, 2010 3:17 PM  Refills Requested: Medication #1:  SIMVASTATIN 20 MG TABS Take 1 tablet by mouth at bedtime for cholesterol   Dosage confirmed as above?Dosage Confirmed  Medication #2:  METOPROLOL SUCCINATE 25 MG XR24H-TAB Take 1 tablet by mouth once a day as needed for heart palpitations.   Dosage confirmed as above?Dosage Confirmed  Medication #3:  AMBIEN 10 MG TABS Take 1 tablet by mouth at bedtime as needed for sleeping problems   Dosage confirmed as above?Dosage Confirmed  Medication #4:  PROVENTIL HFA 108 (90 BASE) MCG/ACT AERS 2 puffs every four hours as needed for shortness of breath. pt requests 90 day supplies due to cost so #90 w/ 3 refills?  last visit 8/23, last labs 06/10/2009  Initial call taken by: Marin Roberts RN,  April 11, 2010 3:18 PM  Follow-up for Phone Call        Would you pls phone in Ambien. All other were faxed Follow-up by: Blanch Media MD,  April 11, 2010 5:08 PM  Additional Follow-up for Phone Call Additional follow up Details #1::        Ambien Rx called to [facility]. Additional Follow-up by: Chinita Pester RN,  April 12, 2010 12:05 PM    Prescriptions: AMBIEN 10 MG TABS (ZOLPIDEM TARTRATE) Take 1 tablet by mouth at bedtime as needed for sleeping problems  #90 x 1   Entered and Authorized by:   Blanch Media MD   Signed by:   Blanch Media MD on 04/11/2010   Method used:   Telephoned to ...       [facility]* (mail-order)             , [place]         Ph: [phone]       Fax: [phone]   RxID:   [id number] SIMVASTATIN 20 MG TABS (SIMVASTATIN) Take 1 tablet by mouth at bedtime for cholesterol  #90 x 1   Entered and Authorized by:   Blanch Media MD   Signed by:   Blanch Media MD on 04/11/2010   Method used:   Electronically to        [facility]* (mail-order)             , [place]         Ph: [phone]       Fax:  [phone]   RxID:   [id number] METOPROLOL SUCCINATE 25 MG XR24H-TAB (METOPROLOL SUCCINATE) Take 1 tablet by mouth once a day as needed for heart palpitations.  #90 x 1   Entered and Authorized by:   Blanch Media MD   Signed by:   Blanch Media MD on 04/11/2010   Method used:   Electronically to        [facility] (mail-order)             , [place]         Ph: [phone]       Fax: [phone]   RxID:   [id number] PROVENTIL HFA 108 (90 BASE) MCG/ACT AERS (ALBUTEROL SULFATE) 2 puffs every four hours as needed for shortness of breath.  #1 x 5   Entered and Authorized by:   Blanch Media MD   Signed by:   Blanch Media MD on 04/11/2010   Method used:   Electronically to        [facility] [employer])             ,  [place]         Ph: [phone]       Fax: [phone]   RxID:   [id number]

## 2010-08-01 NOTE — Progress Notes (Signed)
Summary: refill/ hla  Phone Note Refill Request Message from:  Fax from Pharmacy on August 10, 2009 11:34 AM  Refills Requested: Medication #1:  AMBIEN 10 MG TABS Take 1 tablet by mouth once a day.   Dosage confirmed as above?Dosage Confirmed   Last Refilled: 03/26/2009 Initial call taken by: Marin Roberts RN,  August 10, 2009 11:34 AM  Follow-up for Phone Call        signed. please call in. thanks.    Prescriptions: AMBIEN 10 MG TABS (ZOLPIDEM TARTRATE) Take 1 tablet by mouth once a day  #30 x 0   Entered and Authorized by:   Elby Showers MD   Signed by:   Elby Showers MD on 08/10/2009   Method used:   Telephoned to ...       [facility] [phone]* (retail)       [address]       [place], [place]  [address]       Ph: [phone] or [phone]       Fax: [phone]   RxID:   [id number]

## 2010-08-01 NOTE — Assessment & Plan Note (Signed)
Summary: TO SEE Dmya Long/[facility]   Vital Signs:  Patient profile:   65 year old female Height:      69 inches (175.26 cm) Weight:      199.5 pounds (90.68 kg) BMI:     29.57 Temp:     97.0 degrees F (36.11 degrees C) oral Pulse rate:   62 / minute BP sitting:   129 / 73  (left arm) Cuff size:   large  Vitals Entered By: Cynda Familia Duncan Dull) (December 13, 2009 3:13 PM) CC: HFU-chest pain  Is Patient Diabetic? No Pain Assessment Patient in pain? yes     Location: chest Intensity: 4 Type: pressure Onset of pain  since discharge on 6/2-constant Nutritional Status BMI of 25 - 29 = overweight  Does patient need assistance? Functional Status Self care Ambulation Normal   Primary Care Provider:  Elby Showers MD  CC:  HFU-chest pain .  History of Present Illness: Chest pain is improved. Some pain with deep inspiration-moves around to central chest- tightness. Usually in am before breakfast- feels like the pain "catches her". Stress test  in hospital. Otehr wise no complaints today.  Preventive Screening-Counseling & Management  Alcohol-Tobacco     Alcohol drinks/day: 0     Smoking Status: never  Current Medications (verified): 1)  Nexium 20 Mg Cpdr (Esomeprazole Magnesium) .... Take One Capsule Daily. 2)  Albuterol Sulfate  Nebu (Albuterol Sulfate Nebu) .... Inhale 2 Puffs Every 4-6hours As Needed 3)  Metoprolol Succinate 25 Mg Xr24h-Tab (Metoprolol Succinate) .... Take 1 Tablet By Mouth Once A Day 4)  Celebrex 200 Mg Caps (Celecoxib) .... Take 1 Tablet By Mouth Once A Day 5)  Cyanocobalamin 1000 Mcg/ml Soln (Cyanocobalamin) .... Inject 1 Ml Every Day For One Week and Then Once A Week For One Month and Then Once A Month. 6)  Allegra 180 Mg Tabs (Fexofenadine Hcl) .... Take 1 Tablet By Mouth Once A Day 7)  Caltrate 600+d Plus 600-400 Mg-Unit Tabs (Calcium Carbonate-Vit D-Min) .... Take 1 Tablet By Mouth Two Times A Day 8)  Ambien 10 Mg Tabs (Zolpidem Tartrate) .... Take 1  Tablet By Mouth Once A Day 9)  Simvastatin 20 Mg Tabs (Simvastatin) .... Take 1 Tablet By Mouth Once A Day 10)  Aspirin Ec 81 Mg Tbec (Aspirin) .... Take 1 Tablet By Mouth Once A Day 11)  Nexium 40 Mg Cpdr (Esomeprazole Magnesium) .... Take 1 Tablet By Mouth Once A Day  Allergies: 1)  ! Amoxicillin 2)  ! Aspirin 3)  ! Pcn  Review of Systems      See HPI  Physical Exam  General:  Well-developed,well-nourished,in no acute distress; alert,appropriate and cooperative throughout examination Lungs:  normal respiratory effort.   Heart:  normal rate, regular rhythm, and no murmur.   Abdomen:  soft, non-tender, and normal bowel sounds.   Msk:  NO chest wall tenderness. Extremities:  trace edema Neurologic:  alert & oriented X3 and cranial nerves II-XII intact.   Skin:  color normal and no rashes.   Psych:  Oriented X3, memory intact for recent and remote, and normally interactive.     Impression & Recommendations:  Problem # 1:  GERD (ICD-530.81) Increased Nexium today. Possible GERD underlying CP. Her updated medication list for this problem includes:    Nexium 20 Mg Cpdr (Esomeprazole magnesium) .Marland Kitchen... Take one capsule daily.    Nexium 40 Mg Cpdr (Esomeprazole magnesium) .Marland Kitchen... Take 1 tablet by mouth once a day  Problem # 2:  HYPERTENSION (  ICD-401.9) At goal. No chnages. Her updated medication list for this problem includes:    Metoprolol Succinate 25 Mg Xr24h-tab (Metoprolol succinate) .Marland Kitchen... Take 1 tablet by mouth once a day  BP today: 129/73 Prior BP: 141/83 (12/09/2009)  Labs Reviewed: K+: 3.9 (07/01/2009) Creat: : 0.87 (07/01/2009)   Chol: 217 (07/01/2009)   HDL: 49 (07/01/2009)   LDL: 150 (07/01/2009)   TG: 92 (07/01/2009)  Problem # 3:  VITAMIN B12 DEFICIENCY (ICD-266.2) IM replcement today.  Orders: Vit B12 1000 mcg (J3420) Admin of Therapeutic Inj  intramuscular or subcutaneous (16109)  Complete Medication List: 1)  Nexium 20 Mg Cpdr (Esomeprazole magnesium) ....  Take one capsule daily. 2)  Albuterol Sulfate Nebu (Albuterol sulfate nebu) .... Inhale 2 puffs every 4-6hours as needed 3)  Metoprolol Succinate 25 Mg Xr24h-tab (Metoprolol succinate) .... Take 1 tablet by mouth once a day 4)  Celebrex 200 Mg Caps (Celecoxib) .... Take 1 tablet by mouth once a day 5)  Cyanocobalamin 1000 Mcg/ml Soln (Cyanocobalamin) .... Inject 1 ml every day for one week and then once a week for one month and then once a month. 6)  Allegra 180 Mg Tabs (Fexofenadine hcl) .... Take 1 tablet by mouth once a day 7)  Caltrate 600+d Plus 600-400 Mg-unit Tabs (Calcium carbonate-vit d-min) .... Take 1 tablet by mouth two times a day 8)  Ambien 10 Mg Tabs (Zolpidem tartrate) .... Take 1 tablet by mouth once a day 9)  Simvastatin 20 Mg Tabs (Simvastatin) .... Take 1 tablet by mouth once a day 10)  Aspirin Ec 81 Mg Tbec (Aspirin) .... Take 1 tablet by mouth once a day 11)  Nexium 40 Mg Cpdr (Esomeprazole magnesium) .... Take 1 tablet by mouth once a day  Patient Instructions: 1)  Please schedule a follow-up appointment in 1 month for chest pain. Prescriptions: NEXIUM 40 MG CPDR (ESOMEPRAZOLE MAGNESIUM) Take 1 tablet by mouth once a day  #30 x 6   Entered and Authorized by:   Julaine Fusi  DO   Signed by:   Julaine Fusi  DO on 12/13/2009   Method used:   Electronically to        [facility] [address] [phone]* (retail)       [address]       [place], [place]  [address]       Ph: [phone] or [phone]       Fax: [phone]   RxID:   [id number]    Prevention & Chronic Care Immunizations   Influenza vaccine: Not documented    Tetanus booster: Not documented    Pneumococcal vaccine: Not documented    H. zoster vaccine: Not documented  Colorectal Screening   Hemoccult: Not documented   Hemoccult action/deferral: Ordered  (09/05/2009)    Colonoscopy: Not documented   Colonoscopy action/deferral: Refused  (03/09/2009)  Other Screening    Pap smear: Not documented   Pap smear action/deferral: Not indicated S/P hysterectomy  (09/05/2009)    Mammogram: ASSESSMENT: Negative - BI-RADS 1^MM DIGITAL SCREENING  (09/08/2009)   Mammogram action/deferral: Ordered  (09/05/2009)    DXA bone density scan: Not documented   DXA scan due: 12/2010    Smoking status: never  (12/13/2009)  Lipids   Total Cholesterol: 217  (07/01/2009)   Lipid panel action/deferral: Refused   LDL: 150  (07/01/2009)   LDL Direct: Not documented   HDL: 49  (07/01/2009)   Triglycerides: 92  (07/01/2009)  SGOT (AST): 18  (07/01/2009)   BMP action: Refused   SGPT (ALT): 13  (07/01/2009)   Alkaline phosphatase: 102  (07/01/2009)   Total bilirubin: 0.5  (07/01/2009)  Hypertension   Last Blood Pressure: 129 / 73  (12/13/2009)   Serum creatinine: 0.87  (07/01/2009)   Serum potassium 3.9  (07/01/2009)  Self-Management Support :   Personal Goals (by the next clinic visit) :      Personal blood pressure goal: 130/80  (12/13/2009)   Patient will work on the following items until the next clinic visit to reach self-care goals:     Medications and monitoring: take my medicines every day  (12/13/2009)     Eating: drink diet soda or water instead of juice or soda, eat more vegetables, eat foods that are low in salt, eat baked foods instead of fried foods  (12/13/2009)     Activity: take a 30 minute walk every day, take the stairs instead of the elevator  (09/05/2009)    Hypertension self-management support: Pre-printed educational material, Resources for patients handout, Written self-care plan  (12/13/2009)   Hypertension self-care plan printed.    Lipid self-management support: Pre-printed educational material, Resources for patients handout, Written self-care plan  (12/13/2009)   Lipid self-care plan printed.      Resource handout printed.    Medication Administration  Injection # 1:    Medication: Vit B12 1000 mcg    Diagnosis: VITAMIN B12  DEFICIENCY (ICD-266.2)    Route: IM    Site: L deltoid    Exp Date: 08/2011    Lot #: [id number]    Mfr: [employer]    Patient tolerated injection without complications    Given by: Cynda Familia Duncan Dull) (December 13, 2009 4:28 PM)  Orders Added: 1)  Est. Patient Level IV [24401] 2)  Vit B12 1000 mcg [J3420] 3)  Admin of Therapeutic Inj  intramuscular or subcutaneous [02725]

## 2010-08-01 NOTE — Assessment & Plan Note (Signed)
Summary: B12 INJ/[facility]  Nurse Visit   Allergies: 1)  ! Amoxicillin 2)  ! Aspirin 3)  ! Pcn  Medication Administration  Injection # 1:    Medication: Vit B12 1000 mcg    Diagnosis: VITAMIN B12 DEFICIENCY (ICD-266.2)    Route: IM    Site: L deltoid    Exp Date: 10/2011    Lot #: [id number]    Mfr: [employer]    Comments: pt c/o reaction from last B12 injection, dr Phillips Odor examined area, ok to give todays, given in opposite deltoid area    Patient tolerated injection without complications    Given by: Marin Roberts RN (March 24, 2010 11:05 AM)  Orders Added: 1)  Admin of Therapeutic Inj  intramuscular or subcutaneous [96372] 2)  Vit B12 1000 mcg [J3420]

## 2010-08-01 NOTE — Miscellaneous (Signed)
Summary: Admission H and P  INTERNAL MEDICINE ADMISSION HISTORY AND PHYSICAL  PCP:  Dr. Clent Ridges  1st contact: Dr. Scot Dock [phone] 2nd contact: Dr. Cena Benton: [phone]  Nights/weekends: 1st contact: [phone] 2nd contact: [phone]  CC:  Chest pain  HPI: Pt is a 65 yo female w/ past med hx below who presented to the ER c/o severe chest pain that started while she was walking down her hallway this am.  It started in the L axilla and then moved to the substernal region then over to the R axilla.  It was associated w/ signfiicant SOB and was worsened w/ mvt and breathing.  It then settled in the middle of her chest and she describes the pain as severe pressure.  It was relieved in the ER w/ morphine, nitro, and aspirin.  She has never had this before and has a dry cough that started when she developed the pain.  She notes that over the last week she has been feeling fatigued and had occasional nausea, several episodes of diarrhea, and feeling light headed, no vertigo.  She denies HA, nausea w/ this episode of pain, diaphoresis, fevers, chills, and travel hx.  She notes a lot of sneezing over the last week but denies rhinorrhea and sore throat.    ALLERGIES: AMOXICILLIN ! ASPIRIN-GI upset ! 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 ALBUTEROL SULFATE  NEBU (ALBUTEROL SULFATE NEBU) Inhale 2 puffs every 4-6hours as needed TOPROL XL 50 MG XR24H-TAB (METOPROLOL SUCCINATE) Take one tablet at bedtime. ADVIL 200 MG CAPS (IBUPROFEN) 2 tabs every eight hours as needed for pain. CYANOCOBALAMIN 1000 MCG/ML SOLN (CYANOCOBALAMIN) Inject 1 mL every day for one week and then once a week for one month and then once a month. 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   VITALS: T: 97.8 P: 92 BP: 132/80 R: 26 O2SAT: 100% ON: RA  PHYSICAL EXAM: General:  alert, well-developed, and cooperative to examination.   Head:  normocephalic and atraumatic.   Eyes:  vision grossly intact, pupils equal, pupils round, pupils reactive to light, no injection and anicteric.   Mouth:  pharynx pink and moist, no erythema, poor dentition, upper plate in place.   Neck:  supple, full ROM, no thyromegaly, no JVD, prominent carotid upstroke..   Lungs:  normal respiratory effort, no accessory muscle use, normal breath sounds, no crackles, and no wheezes. Heart:  normal rate, regular rhythm, no murmur, no gallop, and no rub.  Chest wall very tender to palpation on L and substernal region-no rash present. Abdomen:  soft, non-tender, normal bowel sounds, no distention, no guarding, no rebound tenderness, no hepatomegaly, and no splenomegaly.   Msk:  no  joint swelling, no joint warmth, and no redness over joints.   Pulses:  2+ DP/PT pulses bilaterally Extremities:  No cyanosis, clubbing, edema Neurologic:  alert & oriented X3, cranial nerves II-XII intact, strength normal in all extremities, sensation intact to light touch. Skin:  turgor normal and no rashes.   Psych:  Oriented X3, memory intact for recent and remote, normally interactive, good eye contact, not anxious appearing, and not depressed  appearing.   LABS:  WBC                                      7.3               4.0-10.5         K/uL  RBC                                      4.94              3.87-5.11        MIL/uL  Hemoglobin (HGB)                         14.5              12.0-15.0        g/dL  Hematocrit (HCT)                         43.2              36.0-46.0        %  MCV                                      87.4              78.0-100.0       fL  MCHC                                     33.6              30.0-36.0        g/dL  RDW                                      14.1              11.5-15.5        %  Platelet Count (PLT)                     162               150-400          K/uL  Neutrophils, %                           67                43-77            %  Lymphocytes, %  26                12-46            %  Monocytes, %                             5                 3-12             %  Eosinophils, %                           1                 0-5              %  Basophils, %                             1                 0-1              %  Neutrophils, Absolute                    4.9               1.7-7.7          K/uL  Lymphocytes, Absolute                    1.9               0.7-4.0          K/uL  Monocytes, Absolute                      0.4               0.1-1.0          K/uL  Eosinophils, Absolute                    0.1               0.0-0.7          K/uL  Basophils, Absolute                      0.0               0.0-0.1          K/uL  CKMB, POC                                <1.0       l      1.0-8.0          ng/mL  Troponin I, POC                          <0.05             0.00-0.09        ng/mL  Myoglobin, POC                           73.3  12-200           ng/mL  Sodium (NA)                              142               135-145          mEq/L  Potassium (K)                            3.3        l      3.5-5.1          mEq/L  Chloride                                  113        h      96-112           mEq/L  CO2                                      24                19-32            mEq/L  Glucose                                  143        h      70-99            mg/dL  BUN                                      10                6-23             mg/dL  Creatinine                               0.97              0.4-1.2          mg/dL  GFR, Est Non African American            57         l      >60              mL/min  GFR, Est African American                >60               >60              mL/min    Oversized comment, see footnote  1  Calcium                                  10.0              8.4-10.5  mg/dL  Appearance                               HAZY       a      CLEAR  Specific Gravity                         1.015             1.005-1.030  pH                                       5.5               5.0-8.0  Urine Glucose                            NEGATIVE          NEG              mg/dL  Bilirubin                                NEGATIVE          NEG  Ketones                                  NEGATIVE          NEG              mg/dL  Blood                                    NEGATIVE          NEG  Protein                                  NEGATIVE          NEG              mg/dL  Urobilinogen                             1.0               0.0-1.0          mg/dL  Nitrite                                  NEGATIVE          NEG  Leukocytes                               MODERATE   a      NEG  Squamous Epithelial / LPF                FEW        a      RARE  WBC / HPF  3-6               <3               WBC/hpf  RBC / HPF                                0-2               <3               RBC/hpf  Bacteria / HPF                           FEW        a      RARE  Urine-Other                              SEE NOTE.    MUCOUS PRESENT CKMB, POC                                <1.0       l      1.0-8.0          ng/mL   Troponin I, POC                          <0.05             0.00-0.09        ng/mL  Myoglobin, POC                           60.4              12-200           ng/mL  Diagnostic Imaging: EKG: NSR, normal axis, no ST seg/twave abnormalities, normal intervals.  CXR:  Findings: The heart is mildly enlarged.  Vascular congestion is   present.  There is no pulmonary edema or consolidation.  No   pneumothorax or pleural effusion.    IMPRESSION:   Cardiomegaly and vascular congestion but no pulmonary edema.  CT angio chest:     Heart size normal.  No lymphadenopathy.  Minimal bibasilar   dependent atelectasis or scarring is stable.  No pleural or   pericardial effusion.  Central airways are patent.    No acute bony abnormality.    Review of the MIP images confirms the above findings.    IMPRESSION:   No acute cardiopulmonary process.  Specifically, no acute pulmonary   embolism is identified.  ASSESSMENT AND PLAN:  Pt is a 65 yo female w/: 1. Chest pain: CT angio neg for PE, cardiac enlargement, dissection, or pericardial fluid making pericarditis, PE, dissection unlikely.  Also consider ACS but neg EKG and enzymes are reassuring.  She does have risk factors including age and htn, so will admit to tele, cycle enzymes, beta blocker,  and give ASA and nitro as needed.  Will check lipids and a1c to risk stratify.  Also consider msk given significant tenderness on exam and will tx w/ tylenol.  Could be GI so will cont PPI. Also consider pleurisy on diff'l but avoiding NSAID's for now given contrast exposure but if cr ok, may continue. 2.  Htn: Cont betablocker, BP ok. 3. Hypokalemia: likely related to diarrhea.  Check mg and replete. 4. OA: tylenol as needed, avoiding nsaids b/c of recent contrast exposure. 5. GERD: Will increase nexium in case GERD contributing. 6. Hx of B12 def: Will f/u level b/c it appears that she stopped her meds. 7.  VTE PROPH: lovenox  Attending Physician: I  performed and/or observed a history and physical examination of the patient.  I discussed the case with the residents as noted and reviewed the residents' notes.  I agree with the findings and plan--please refer to the attending physician note for more details.  Signature________________________________  Printed Name_____________________________

## 2010-08-01 NOTE — Assessment & Plan Note (Signed)
Summary: NECK AND R SHOULDER PAIN X ONE YEAR   Vital Signs:  Patient profile:   65 year old female BP sitting:   133 / 86  Vitals Entered By: Lillia Pauls CMA (September 16, 2009 10:00 AM)  Primary Care Provider:  Elby Showers MD   History of Present Illness: 65 yo F here for R neck and posterior shoulder pain  Has had symptoms for over a year No known injury Most of pain is right cervical/upper thoracic/trapezius region. Pain is worst with turning head - specifically cannot turn fully to right side because of pain in this area. Has tried heat, pain rub without much help. No numbness/tingling in extremities No weakness No bowel/bladder issues Has had workup for right shoulder - x-rays negative and I could review these A chiropractor took x-rays of her neck but she does not have these with her today Has pain with overhead activities of right shoulder but most of pain is posterior shoulder. + some night pain and pain when rolls onto this side Is right handed  Allergies (verified): 1)  ! Amoxicillin 2)  ! Aspirin 3)  ! Pcn  Physical Exam  General:  alert, NAD. Msk:  Neck: No gross deformity, swelling, bruising. R paraspinal/trapezius TTP.  No midline/bony TTP. Only has 20 degrees right lat rotation and extension.  FROM flexion and turning to left though some pain on flexion. BUE strength 5/5.  Sensation intact to light touch.  2+ equal reflexes in triceps, biceps, brachioradialis tendons. Negative spurlings - could not adequately perform turning to right side though. NV intact distal BUEs.  R shoulder: No gross deformity, swelling, atrophy. TTP noted above but none anterior shoulder or AC joint. FROM with mild painful arc - mostly pain in trapezius though. Strength 5/5 with rotator cuff testing+ + Hawkins and neers Negative speed/yergason. NV intact distally.   Impression & Recommendations:  Problem # 1:  NECK PAIN (ICD-723.1) Assessment New Trial physical  therapy first.  We discussed consideration of adding neurontin or a TCA to help with probable nerve irritation/muscle spasms in back but she would like to hold off on this.  Heat as needed, aleve as needed.  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   Her updated medication list for this problem includes:    Advil 200 Mg Caps (Ibuprofen) .Marland Kitchen... 2 tabs every eight hours as needed for pain.  Problem # 2:  SHOULDER PAIN, RIGHT, CHRONIC (ICD-719.41) Assessment: Deteriorated She likely has an element of Rotator cuff impingement though neck issues make this difficult to completely discern.  Discussed a diagnostic/therapeutic subacromial injection but she would like to wait on this.  Otherwise can do simple exercises to strengthen rotator cuff.  She will start PT.  Her updated medication list for this problem includes:    Advil 200 Mg Caps (Ibuprofen) .Marland Kitchen... 2 tabs every eight hours as needed for pain.  Complete Medication List: 1)  Nexium 20 Mg Cpdr (Esomeprazole magnesium) .... Take one capsule daily. 2)  Albuterol Sulfate Nebu (Albuterol sulfate nebu) .... Inhale 2 puffs every 4-6hours as needed 3)  Toprol Xl 50 Mg Xr24h-tab (Metoprolol succinate) .... Take one tablet at bedtime. 4)  Advil 200 Mg Caps (Ibuprofen) .... 2 tabs every eight hours as needed for pain. 5)  Cyanocobalamin 1000 Mcg/ml Soln (Cyanocobalamin) .... Inject 1 ml every day for one week and then once a week  for one month and then once a month. 6)  Allegra 180 Mg Tabs (Fexofenadine hcl) .... Take 1 tablet by mouth once a day 7)  Caltrate 600+d Plus 600-400 Mg-unit Tabs (Calcium carbonate-vit d-min) .... Take 1 tablet by mouth two times a day 8)  Ambien 10 Mg Tabs (Zolpidem tartrate) .... Take 1 tablet by mouth once a day  Patient Instructions: 1)  Start physical therapy for your neck at [facility] - they will call you with your first appointment. 2)  Continue  using heat over this area for 15 minutes at a time 3-4 times a day. 3)  Advil OR aleve is ok to use as needed for pain/inflammation. 4)  We have the option of adding a nerve medicine to decrease the inflammation as well if physical therapy is not helping enough. 5)  We can also inject your right shoulder to help with the rotator cuff tendinitis should you want to pursue this. 6)  Try to get a copy of your x-rays from the chiropractor - we could always repeat these but shouldn't if you've already had these done. 7)  Follow up with [place] in 4-6 weeks for a repeat evaluation.

## 2010-08-01 NOTE — Assessment & Plan Note (Signed)
Summary: Soc. Work  15 min. Social Work.  Met with patient in exam room.  She reports that she is a new Medicaid recipient.  Her income includes $373 for SSI plus $108 and she also receives$110 that goes toward her Medicare Part B premium.    The patient has a total of three insurances:  Medicare, [employer]) and now Medicaid.   She pays nothing for these three insurances.  Also, her drugs are about $12 per month under Tricare.   Explained to patient that Medicare continues to be primary and then Tricare and Medicaid will coordinate benefits.   She was instructed to let the front desk know about her Medicaid.   I told her there is nothing that she needs to do unless she wants to get on a Medicare D plan which she is not required to do since she has credible coverage thru T[employer]are.   The patient has my card and was encouraged to call me should issues arise with her insurances and we will advocate on her behalf.

## 2010-08-01 NOTE — Letter (Signed)
Summary: [facility] PT referral form  [facility] PT referral form   Imported By: Marily Memos 12/09/2009 11:05:19  _____________________________________________________________________  External Attachment:    Type:   Image     Comment:   External Document
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
## 2010-08-01 NOTE — Progress Notes (Signed)
Summary: FOLLOW UP WITH REFERALS  Phone Note Other Incoming   Caller: Patient Summary of Call: Patient called and stated that she kept her Appointment for the Dermatologist.  She is asking about the Ortho/Ref [phone].  She can be reached at that November.  Initial call taken by: Shon Hough,  May 08, 2010 9:37 AM  Follow-up for Phone Call        Appointment scheduled, message left for patient Follow-up by: [facility] NT II,  May 16, 2010 10:34 AM

## 2010-08-01 NOTE — Progress Notes (Signed)
  Phone Note Outgoing Call   Call placed by: Theotis Barrio NT II,  May 03, 2010 3:52 PM Call placed to: Patient Details for Reason: DERMATOLOGY REFERRAL Summary of Call: TRIED AGAIN TO CONTACT THIS PATIENT BY PHONE, MAIN PH# CHANGED WITH NO INFO.  2ND PHONE # NO ANSWER.. WILL SEND THIS PATIENT A LETTER TO CONTACT OUR OFFICE.

## 2010-08-01 NOTE — Assessment & Plan Note (Signed)
Summary: F/U,[facility]   Vital Signs:  Patient profile:   65 year old female BP sitting:   167 / 82  Vitals Entered By: Lillia Pauls CMA (October 28, 2009 8:49 AM)  Primary Care Provider:  Elby Showers MD   History of Present Illness: Pt presents for follow-up of right shoulder and trapezius pain in addition to right hip and SI joint pain. Shoulder is feeling better but still has difficulty with ROM above her head Not having to take regular medications for pain Still feels a popping sensation at times which causes pain Able to sleep comfortably on her shoulder now Has been going to PT at [facility] on [address] 3 x per week and is getting kinesio taping Has more PT set up for next week Still having some difficulty with turning her neck to the right due to pain  Her right hip is sore laterally but mainly along her lower right back Constant pain Worse with getting up and changing positions Does get numbness down to her right foot Wears a sock at night for a while because the foot feels cold Denies any prior injuries or surgeries to her back  Allergies: 1)  ! Amoxicillin 2)  ! Aspirin 3)  ! Pcn  Physical Exam  General:  alert and well-developed.   Head:  normocephalic and atraumatic.   Neck:  supple.  Normal neck flexion, extension and turning to the left. Pain and limited rotation with turning to the right, lacking 20 degrees Mild TTP over C5/C6 5/5 grip strength Lungs:  normal respiratory effort.   Msk:  Right Shoulder: Normal inspection Active forward flexion to 100 degrees but painful crepitus limiting her exam due to pain. No tightness of the joint capsule. Abduction to 120 degrees before pain. + TTP over upper periscapular region and trapezius. Mild TTP over the [facility] joint 4/5 strength with resisted internal and external rotation + Hawkin's, + Neer's, + Speed's Neg Cross over 5/5 grip strength Neurovascularly intact  Left Shoulder: Normal inspection, full ROM, neg special  testing, 5/5 strength throughout, No TTP throughout, Neurovascularly intact  Right Hip: Normal inspection IR of 20 degrees adn ER of 20 degrees causing some pain in the groin + TTP over the trochanteric bursa 4/5 hip flexor strength + FABER testing + TTP of right SI joint recreating her pain  Left Hip: Normal inspection IR of 25 degrees and ER of 25 degrees No TTP throughout + FABER testing 4/5 hip flexor strength No SI joint pain   Impression & Recommendations:  Problem # 1:  SACROILIAC JOINT DYSFUNCTION (ICD-724.6) Assessment New  Patient will be referred for physical therapy now for both her right shoulder and right SI joint She consented to have a steroid injection into her right SI joint  Consent obtained and verified. Sterile betadine prep. Furthur cleansed with alcohol. Topical analgesic spray: Ethyl chloride. Joint: R SI joint Approached in typical fashion with: Completed without difficulty Meds: 2 cc 1% Lidocaine, 2 cc 0.5% Marcaine, 40 mg of Kenalog Needle: spinal Aftercare instructions and Red flags advised. No heavy lifting for 3 days. Orders: Joint Aspirate / Injection, Intermediate (20605) Kenalog 10 mg inj (J3301)  Problem # 2:  TROCHANTERIC BURSITIS (ICD-726.5) Assessment: New  Most likely from her back and SI joint pain Also consented for an injection into her trochanteric bursa  Consent obtained and verified. Sterile betadine prep. Furthur cleansed with alcohol. Topical analgesic spray: Ethyl chloride. Joint: Right Trochanteric Bursa Approached in typical fashion with: Completed without  difficulty Meds: 40mg  Kenalog, 7 cc 1% Lidocaine Needle: Spinal Aftercare instructions and Red flags advised. No heavy lifting for 3 days.  Orders: Joint Aspirate / Injection, Large (20610) Kenalog 10 mg inj (J3301)  Problem # 3:  SHOULDER PAIN, RIGHT, CHRONIC (ICD-719.41) Continue PT Crepitus likely from severe arthritis Consider steroid injection at  next visit Return in 6-8 weeks  Her updated medication list for this problem includes:    Advil 200 Mg Caps (Ibuprofen) .Marland Kitchen... 2 tabs every eight hours as needed for pain.  Complete Medication List: 1)  Nexium 20 Mg Cpdr (Esomeprazole magnesium) .... Take one capsule daily. 2)  Albuterol Sulfate Nebu (Albuterol sulfate nebu) .... Inhale 2 puffs every 4-6hours as needed 3)  Toprol Xl 50 Mg Xr24h-tab (Metoprolol succinate) .... Take one tablet at bedtime. 4)  Advil 200 Mg Caps (Ibuprofen) .... 2 tabs every eight hours as needed for pain. 5)  Cyanocobalamin 1000 Mcg/ml Soln (Cyanocobalamin) .... Inject 1 ml every day for one week and then once a week for one month and then once a month. 6)  Allegra 180 Mg Tabs (Fexofenadine hcl) .... Take 1 tablet by mouth once a day 7)  Caltrate 600+d Plus 600-400 Mg-unit Tabs (Calcium carbonate-vit d-min) .... Take 1 tablet by mouth two times a day 8)  Ambien 10 Mg Tabs (Zolpidem tartrate) .... Take 1 tablet by mouth once a day Joint Aspirate / Injection, Large (20610) Joint Aspirate / Injection, Intermediate (20605) Kenalog 10 mg inj (Z6109)   Impression & Recommendations:     Orders: Joint Aspirate / Injection, Intermediate (60454) Kenalog 10 mg inj (U9811)         Orders: Joint Aspirate / Injection, Large (20610) Kenalog 10 mg inj (B1478)      Her updated medication list for this problem includes:    Advil 200 Mg Caps (Ibuprofen) .Marland Kitchen... 2 tabs every eight hours as needed for pain.   Complete Medication List: 1)  Nexium 20 Mg Cpdr (Esomeprazole magnesium) .... Take one capsule daily. 2)  Albuterol Sulfate Nebu (Albuterol sulfate nebu) .... Inhale 2 puffs every 4-6hours as needed 3)  Toprol Xl 50 Mg Xr24h-tab (Metoprolol succinate) .... Take one tablet at bedtime. 4)  Advil 200 Mg Caps (Ibuprofen) .... 2 tabs every eight hours as needed for pain. 5)  Cyanocobalamin 1000 Mcg/ml Soln (Cyanocobalamin) .... Inject 1 ml every day for one  week and then once a week for one month and then once a month. 6)  Allegra 180 Mg Tabs (Fexofenadine hcl) .... Take 1 tablet by mouth once a day 7)  Caltrate 600+d Plus 600-400 Mg-unit Tabs (Calcium carbonate-vit d-min) .... Take 1 tablet by mouth two times a day 8)  Ambien 10 Mg Tabs (Zolpidem tartrate) .... Take 1 tablet by mouth once a day

## 2010-08-01 NOTE — Miscellaneous (Signed)
Summary: Orders Update  Clinical Lists Changes  Problems: Added new problem of INADEQUATE MATERIAL RESOURCES (ICD-V60.2) Orders: Added new Referral order of Social Work Referral (Social ) - Signed 

## 2010-08-03 NOTE — Assessment & Plan Note (Signed)
Summary: B12 INJ/[facility]  Nurse Visit   Allergies: 1)  ! Amoxicillin 2)  ! Aspirin 3)  ! Pcn 4)  ! * Nose Sprays - Inc Steroid Sprays  Medication Administration  Injection # 1:    Medication: Vit B12 1000 mcg    Diagnosis: VITAMIN B12 DEFICIENCY (ICD-266.2)    Route: IM    Site: R deltoid    Exp Date: 12/31/2010    Lot #: [id number]    Mfr: [employer]    Patient tolerated injection without complications    Given by: Merrie Roof RN (July 05, 2010 2:42 PM)  Orders Added: 1)  Vit B12 1000 mcg [J3420] 2)  Admin of Therapeutic Inj  intramuscular or subcutaneous [45409]

## 2010-08-03 NOTE — Assessment & Plan Note (Signed)
Summary: F/U,[facility]   Vital Signs:  Patient profile:   64 year old female BP sitting:   128 / 85  Vitals Entered By: Lillia Pauls CMA (June 12, 2010 2:57 PM)  Primary Care Provider:  Blanch Media MD   History of Present Illness: RIGHT shoulder not any better after injectionat last ov. No new signs.  Also continues to have right hip pains as previously detailed--no change  Current Medications (verified): 1)  Nexium 20 Mg Cpdr (Esomeprazole Magnesium) .... Take One Capsule Daily. 2)  Proventil Hfa 108 (90 Base) Mcg/act Aers (Albuterol Sulfate) .... 2 Puffs Every Four Hours As Needed For Shortness of Breath. 3)  Metoprolol Succinate 25 Mg Xr24h-Tab (Metoprolol Succinate) .... Take 1 Tablet By Mouth Once A Day 4)  Cyanocobalamin 1000 Mcg/ml Soln (Cyanocobalamin) .... Inject 1 Ml Every Day For One Week and Then Once A Week For One Month and Then Once A Month. 5)  Caltrate 600+d Plus 600-400 Mg-Unit Tabs (Calcium Carbonate-Vit D-Min) .... Take 1 Tablet By Mouth Two Times A Day 6)  Ambien 10 Mg Tabs (Zolpidem Tartrate) .... Take 1 Tablet By Mouth At Bedtime As Needed For Sleeping Problems 7)  Simvastatin 20 Mg Tabs (Simvastatin) .... Take 1 Tablet By Mouth At Bedtime For Cholesterol 8)  Aspirin Ec 81 Mg Tbec (Aspirin) .... Take 1 Tablet By Mouth Once A Day 9)  Patanol 0.1 % Soln (Olopatadine Hcl) .Marland Kitchen.. 1 Drop Into Both Eyes Two Times A Day  For Allergies  Allergies: 1)  ! Amoxicillin 2)  ! Aspirin 3)  ! Pcn 4)  ! * Nose Sprays - Inc Steroid Sprays  Review of Systems  The patient denies fever, weight loss, and weight gain.    Physical Exam  Msk:  pain with supraspinatus testing  RIght hip pain with ER but full ER   Impression & Recommendations:  Problem # 1:  SHOULDER PAIN, RIGHT (ICD-719.41)  Her updated medication list for this problem includes:    Aspirin Ec 81 Mg Tbec (Aspirin) .Marland Kitchen... Take 1 tablet by mouth once a day  Problem # 2:  PAIN IN JOINT PELVIC REGION AND  THIGH (ICD-719.45)  Her updated medication list for this problem includes:    Aspirin Ec 81 Mg Tbec (Aspirin) .Marland Kitchen... Take 1 tablet by mouth once a day back films showed some djd will send her to PT for both #1 and #2  Complete Medication List: 1)  Nexium 20 Mg Cpdr (Esomeprazole magnesium) .... Take one capsule daily. 2)  Proventil Hfa 108 (90 Base) Mcg/act Aers (Albuterol sulfate) .... 2 puffs every four hours as needed for shortness of breath. 3)  Metoprolol Succinate 25 Mg Xr24h-tab (Metoprolol succinate) .... Take 1 tablet by mouth once a day 4)  Cyanocobalamin 1000 Mcg/ml Soln (Cyanocobalamin) .... Inject 1 ml every day for one week and then once a week for one month and then once a month. 5)  Caltrate 600+d Plus 600-400 Mg-unit Tabs (Calcium carbonate-vit d-min) .... Take 1 tablet by mouth two times a day 6)  Ambien 10 Mg Tabs (Zolpidem tartrate) .... Take 1 tablet by mouth at bedtime as needed for sleeping problems 7)  Simvastatin 20 Mg Tabs (Simvastatin) .... Take 1 tablet by mouth at bedtime for cholesterol 8)  Aspirin Ec 81 Mg Tbec (Aspirin) .... Take 1 tablet by mouth once a day 9)  Patanol 0.1 % Soln (Olopatadine hcl) .Marland Kitchen.. 1 drop into both eyes two times a day  for allergies   Orders Added:  1)  Est. Patient Level III [04540]

## 2010-08-03 NOTE — Letter (Signed)
Summary: [facility] Pt referral  [facility] Pt referral   Imported By: Marily Memos 06/13/2010 10:19:18  _____________________________________________________________________  External Attachment:    Type:   Image     Comment:   External Document

## 2010-08-03 NOTE — Progress Notes (Signed)
Summary: refill/gg  Phone Note Refill Request  on June 09, 2010 5:12 PM  Refills Requested: Medication #1:  NEXIUM 20 MG CPDR Take one capsule daily.   Last Refilled: 06/02/2010  Method Requested: Electronic Initial call taken by: Merrie Roof RN,  June 09, 2010 5:12 PM  Follow-up for Phone Call       Follow-up by: Blanch Media MD,  June 12, 2010 5:34 PM    Prescriptions: NEXIUM 20 MG CPDR (ESOMEPRAZOLE MAGNESIUM) Take one capsule daily.  #90 x 5   Entered and Authorized by:   Blanch Media MD   Signed by:   Blanch Media MD on 06/12/2010   Method used:   Electronically to        [facility] [address] [phone]* (retail)       [address]       [place], [place]  [address]       Ph: [phone] or [phone]       Fax: [phone]   RxID:   [id number]

## 2010-08-03 NOTE — Letter (Signed)
Summary: [facility]  [address]   [place], [place] [address]   Phone: [phone]  Fax: [phone]    06/09/2010  Jessica Conrad [address] [place], [place]  [address]  Dear [name], Your back x ray looked ok--somemild arthritis. 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 Have a happy holiday!         Sincerely,   Denny Levy MD  Appended Document: XRAY Letter mailed

## 2010-08-07 ENCOUNTER — Other Ambulatory Visit: Payer: Medicare Other

## 2010-08-07 ENCOUNTER — Ambulatory Visit: Payer: Medicare Other | Attending: Family Medicine | Admitting: Rehabilitative and Restorative Service Providers"

## 2010-08-07 DIAGNOSIS — M2560 Stiffness of unspecified joint, not elsewhere classified: Secondary | ICD-10-CM | POA: Insufficient documentation

## 2010-08-07 DIAGNOSIS — E538 Deficiency of other specified B group vitamins: Secondary | ICD-10-CM

## 2010-08-07 DIAGNOSIS — M2550 Pain in unspecified joint: Secondary | ICD-10-CM | POA: Insufficient documentation

## 2010-08-07 DIAGNOSIS — IMO0001 Reserved for inherently not codable concepts without codable children: Secondary | ICD-10-CM | POA: Insufficient documentation

## 2010-08-07 MED ORDER — CYANOCOBALAMIN 1000 MCG/ML IJ SOLN
1000.0000 ug | INTRAMUSCULAR | Status: AC
  Administered 2010-08-07 – 2010-09-27 (×2): 1000 ug via INTRAMUSCULAR

## 2010-08-09 ENCOUNTER — Encounter: Payer: Self-pay | Admitting: Physical Therapy

## 2010-08-14 ENCOUNTER — Encounter: Payer: Self-pay | Admitting: Internal Medicine

## 2010-08-14 DIAGNOSIS — I10 Essential (primary) hypertension: Secondary | ICD-10-CM

## 2010-08-14 DIAGNOSIS — R0789 Other chest pain: Secondary | ICD-10-CM

## 2010-08-14 DIAGNOSIS — M8580 Other specified disorders of bone density and structure, unspecified site: Secondary | ICD-10-CM

## 2010-08-14 DIAGNOSIS — J45909 Unspecified asthma, uncomplicated: Secondary | ICD-10-CM

## 2010-08-14 DIAGNOSIS — E785 Hyperlipidemia, unspecified: Secondary | ICD-10-CM

## 2010-08-14 DIAGNOSIS — Z86711 Personal history of pulmonary embolism: Secondary | ICD-10-CM | POA: Insufficient documentation

## 2010-08-14 DIAGNOSIS — G8929 Other chronic pain: Secondary | ICD-10-CM

## 2010-08-14 DIAGNOSIS — J302 Other seasonal allergic rhinitis: Secondary | ICD-10-CM | POA: Insufficient documentation

## 2010-08-14 DIAGNOSIS — E538 Deficiency of other specified B group vitamins: Secondary | ICD-10-CM | POA: Insufficient documentation

## 2010-08-14 DIAGNOSIS — K219 Gastro-esophageal reflux disease without esophagitis: Secondary | ICD-10-CM

## 2010-08-14 DIAGNOSIS — G4700 Insomnia, unspecified: Secondary | ICD-10-CM

## 2010-08-29 ENCOUNTER — Telehealth: Payer: Self-pay | Admitting: *Deleted

## 2010-08-29 ENCOUNTER — Ambulatory Visit (INDEPENDENT_AMBULATORY_CARE_PROVIDER_SITE_OTHER): Payer: Medicare Other | Admitting: Internal Medicine

## 2010-08-29 ENCOUNTER — Encounter: Payer: Self-pay | Admitting: Internal Medicine

## 2010-08-29 VITALS — BP 134/79 | HR 64 | Temp 96.7°F | Ht 69.0 in | Wt 212.6 lb

## 2010-08-29 DIAGNOSIS — J45909 Unspecified asthma, uncomplicated: Secondary | ICD-10-CM

## 2010-08-29 DIAGNOSIS — G4700 Insomnia, unspecified: Secondary | ICD-10-CM

## 2010-08-29 DIAGNOSIS — Z86711 Personal history of pulmonary embolism: Secondary | ICD-10-CM

## 2010-08-29 DIAGNOSIS — M899 Disorder of bone, unspecified: Secondary | ICD-10-CM

## 2010-08-29 DIAGNOSIS — E538 Deficiency of other specified B group vitamins: Secondary | ICD-10-CM

## 2010-08-29 DIAGNOSIS — I10 Essential (primary) hypertension: Secondary | ICD-10-CM

## 2010-08-29 DIAGNOSIS — G8929 Other chronic pain: Secondary | ICD-10-CM

## 2010-08-29 DIAGNOSIS — K219 Gastro-esophageal reflux disease without esophagitis: Secondary | ICD-10-CM

## 2010-08-29 DIAGNOSIS — M8580 Other specified disorders of bone density and structure, unspecified site: Secondary | ICD-10-CM

## 2010-08-29 DIAGNOSIS — E785 Hyperlipidemia, unspecified: Secondary | ICD-10-CM

## 2010-08-29 DIAGNOSIS — Z1231 Encounter for screening mammogram for malignant neoplasm of breast: Secondary | ICD-10-CM

## 2010-08-29 DIAGNOSIS — R61 Generalized hyperhidrosis: Secondary | ICD-10-CM | POA: Insufficient documentation

## 2010-08-29 LAB — CBC WITH DIFFERENTIAL/PLATELET
Basophils Absolute: 0 10*3/uL (ref 0.0–0.1)
Basophils Relative: 0 % (ref 0–1)
Eosinophils Absolute: 0.1 10*3/uL (ref 0.0–0.7)
Eosinophils Relative: 1 % (ref 0–5)
HCT: 43.2 % (ref 36.0–46.0)
Lymphocytes Relative: 41 % (ref 12–46)
MCH: 27.7 pg (ref 26.0–34.0)
MCHC: 32.2 g/dL (ref 30.0–36.0)
MCV: 86.2 fL (ref 78.0–100.0)
Monocytes Absolute: 0.3 10*3/uL (ref 0.1–1.0)
Monocytes Relative: 5 % (ref 3–12)
Platelets: 234 10*3/uL (ref 150–400)
RDW: 13.8 % (ref 11.5–15.5)

## 2010-08-29 LAB — COMPREHENSIVE METABOLIC PANEL
AST: 15 U/L (ref 0–37)
BUN: 10 mg/dL (ref 6–23)
Calcium: 10 mg/dL (ref 8.4–10.5)
Chloride: 105 mEq/L (ref 96–112)
Creat: 0.87 mg/dL (ref 0.40–1.20)
Glucose, Bld: 85 mg/dL (ref 70–99)
Total Bilirubin: 0.5 mg/dL (ref 0.3–1.2)

## 2010-08-29 LAB — LIPID PANEL
Cholesterol: 250 mg/dL — ABNORMAL HIGH (ref 0–200)
HDL: 44 mg/dL (ref >39)
Total CHOL/HDL Ratio: 5.7 Ratio
VLDL: 40 mg/dL (ref 0–40)

## 2010-08-29 MED ORDER — ESOMEPRAZOLE MAGNESIUM 20 MG PO CPDR: 20.0000 mg | DELAYED_RELEASE_CAPSULE | Freq: Every day | ORAL | Status: DC

## 2010-08-29 MED ORDER — SIMVASTATIN 20 MG PO TABS: 20.0000 mg | ORAL_TABLET | Freq: Every day | ORAL | Status: DC

## 2010-08-29 MED ORDER — OLOPATADINE HCL 0.1 % OP SOLN: 1.0000 [drp] | Freq: Two times a day (BID) | OPHTHALMIC | Status: DC

## 2010-08-29 MED ORDER — ALBUTEROL SULFATE HFA 108 (90 BASE) MCG/ACT IN AERS: 2.0000 | INHALATION_SPRAY | Freq: Four times a day (QID) | RESPIRATORY_TRACT | Status: DC | PRN

## 2010-08-29 MED ORDER — CALCIUM CARBONATE-VITAMIN D 600-400 MG-UNIT PO TABS: 1.0000 | ORAL_TABLET | Freq: Two times a day (BID) | ORAL | Status: DC

## 2010-08-29 MED ORDER — METOPROLOL SUCCINATE ER 25 MG PO TB24: 25.0000 mg | ORAL_TABLET | Freq: Every day | ORAL | Status: DC

## 2010-08-29 MED ORDER — ASPIRIN 81 MG PO TBEC: 81.0000 mg | DELAYED_RELEASE_TABLET | Freq: Every day | ORAL | Status: DC

## 2010-08-29 MED ORDER — ZOLPIDEM TARTRATE 10 MG PO TABS: 10.0000 mg | ORAL_TABLET | Freq: Every evening | ORAL | Status: DC | PRN

## 2010-08-29 NOTE — Assessment & Plan Note (Signed)
She uses the Ambien as needed though it does not always work quickly. Sometimes she will stay awake until 3:00 until she falls asleep. I encouraged her to take the Ambien on an empty stomach.

## 2010-08-29 NOTE — Progress Notes (Signed)
  Subjective:    Patient ID: Jessica Conrad, female    DOB: [birth year], 65 y.o.   MRN: [id number]  HPI Please see the A&P for the status of the pt's chronic medical problems.    Review of Systems  Constitutional: Positive for fever, diaphoresis, activity change, fatigue and unexpected weight change. Negative for chills and appetite change.  HENT: Positive for facial swelling and neck stiffness. Negative for nosebleeds.   Respiratory: Positive for shortness of breath. Negative for cough.   Cardiovascular: Negative for chest pain.  Gastrointestinal: Negative for abdominal pain, diarrhea and blood in stool.  Genitourinary: Positive for vaginal discharge. Negative for hematuria and vaginal bleeding.  Musculoskeletal: Positive for myalgias, joint swelling, arthralgias and gait problem.  Skin: Positive for rash.  Neurological: Positive for headaches.  Hematological: Positive for adenopathy. Bruises/bleeds easily.  Psychiatric/Behavioral: Positive for sleep disturbance. The patient is nervous/anxious.        Objective:   Physical Exam  Constitutional: She is oriented to person, place, and time. She appears well-developed and well-nourished. No distress.  HENT:  Head: Normocephalic and atraumatic.  Right Ear: External ear normal.  Left Ear: External ear normal.  Eyes: Conjunctivae and EOM are normal. Pupils are equal, round, and reactive to light.  Neck: Normal range of motion. Neck supple. No tracheal deviation present. No thyromegaly present.  Cardiovascular: Normal rate and regular rhythm.        Can see pulsations of carotids B  Pulmonary/Chest: Effort normal and breath sounds normal. No respiratory distress. She has no wheezes. She has no rales.  Abdominal: Soft. Bowel sounds are normal. She exhibits no distension. There is no tenderness.  Musculoskeletal: Normal range of motion. She exhibits edema. She exhibits no tenderness.  Lymphadenopathy:    She has no cervical adenopathy.    Neurological: She is alert and oriented to person, place, and time. Coordination normal.  Skin: Skin is warm and dry. No rash noted. She is not diaphoretic. No erythema.  Psychiatric: She has a normal mood and affect. Her behavior is normal. Judgment and thought content normal.          Assessment & Plan:

## 2010-08-29 NOTE — Assessment & Plan Note (Addendum)
Can go one or two days without pain. Fried or spicy food sets off the pain. She is happy using the meds prn

## 2010-08-29 NOTE — Assessment & Plan Note (Addendum)
Right hip pain -  Her hip still hurts. She has had this evaluated in the past as detailed in the overview. She uses Advil as needed  Shoulder and neck pain-  Her neck no longer hurts after physical therapy. She still gets shoulder pain occasionally and cannot lie on her right side  L ankle pain -  This occurred about 2004 when she was living in [place].  Her husband stepped on her left foot. She went to the emergency room and had it evaluated. I do not have those records. States she was told her foot was crushed. She did not have surgery nor did she have a cast.  She had a plain x-ray in December 2009 that showed osteopenia, soft tissue swelling, and degenerative changes.  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  She saw orthopedics in 2009 following the x-ray. There was no therapy done on the ankle as the visit with orthopedics focused mostly on her trochanteric bursitis and hip pain.  Currently she gets pain when she stands or walks a long time. She walks daily at the mall or in the park. She has noticed a lump on the dorsum of the left foot. There is no point tenderness. She has no decreased range of motion of the ankle.  She uses Advil which lightens the pain a bit. She also had been given a source of patches to put but she does not know the name of the patch she'll call us later today with the name so that I can refill it.  Because she has new changes to the foot I discussed the possibility of orthopedic referral. Jessica Conrad was interested in going to get a second opinion of her ankle.

## 2010-08-29 NOTE — Assessment & Plan Note (Signed)
I reviewed the details in the overview with Ms. Agan and they are accurate.

## 2010-08-29 NOTE — Assessment & Plan Note (Signed)
She wanted to know whether she needs to continue her statin. Her last fasting lipid panel about 9 months ago was controlled She has never had a stroke, never had a heart attack, and does not have diabetes.  She has been on statins off and on for 6-10 years. She states that once her cholesterol is controlled her doctor will then take her off her statin and it will increase and then her cholesterol will increase again. I told her to continue her statin and investigate to see how high her cholesterol was prior to starting a statin. Will check fasting lipid panel today along with LFTs

## 2010-08-29 NOTE — Assessment & Plan Note (Signed)
Continue calcium and vitamin D 

## 2010-08-29 NOTE — Assessment & Plan Note (Signed)
When the weather gets hot it sounds like she uses a lots of albuterol. She states she goes through 3 MDI's a week. She states that the MDIs are very small and do not contain a lot of the medicine. D. Allegra helps her allergies quite a bit though her insurance does not cover this. She wanted to know if there is a substitute. She failed Claritin and other meds on the $4 plans. I told her I cannot think of a substitute.

## 2010-08-29 NOTE — Assessment & Plan Note (Signed)
Patient's blood pressure was 134/79 today. She has had slightly elevated blood pressures in the past but not sustained. She does not use her metoprolol on a daily basis but only uses it when she gets palpitations. When she does need it she takes a half a pill which probably isn't the best thing since it is an extended release pill. No change in therapy today

## 2010-08-29 NOTE — Telephone Encounter (Signed)
Message copied by Chinita Pester on Tue Aug 29, 2010  1:34 PM ------      Message from: Blanch Media      Created: Tue Aug 29, 2010 11:25 AM       Would ypu pls phone in Ambien - I refilled it in today's visit. Thanks

## 2010-08-29 NOTE — Assessment & Plan Note (Signed)
She has had this for 6 months. She used to always be cold but now she is always hot. During the day she no longer needs to bundle up like she used to. At night if she turns her heat off and is not able to sleep with any coverings. However she sweats and soaks her nightgown. She has lost about 5-10 pounds over the past 6 months. She denies cough, hemoptysis, blood in her stools, or severe fatigue.  She states that she had hot flashes after her hysterectomy and oophorectomy. After the birth of her son, she had a hemorrhage and had one ovary taken out. After that she had severe pain in her right side and was found to have a cyst on her ovary. At that time her uterus and her remaining ovary were removed.  She then on hot flashes but they went away and she has been without hot flashes for quite sometime until 6 months ago.   I'm concerned about malignancy as the cause of her night sweats. I am getting blood work today, a mammogram, & stool cards. She had a colonoscopy a number of years ago and refuses to consider another colonoscopy at this time. I cannot find documentation of her prior colonoscopy.

## 2010-08-29 NOTE — Telephone Encounter (Signed)
Ambien Rx called to [facility] on [address]

## 2010-08-29 NOTE — Assessment & Plan Note (Signed)
She just got her a vitamin B12 injection earlier this month and is up-to-date.

## 2010-08-30 ENCOUNTER — Telehealth: Payer: Self-pay | Admitting: *Deleted

## 2010-08-30 MED ORDER — DICLOFENAC EPOLAMINE 1.3 % TD PTCH: 1.0000 | MEDICATED_PATCH | Freq: Two times a day (BID) | TRANSDERMAL | Status: DC | PRN

## 2010-08-30 NOTE — Progress Notes (Signed)
Quick Note:  No explanation of night sweats based on lab results. Will await MMG and stool card results. ______

## 2010-08-30 NOTE — Progress Notes (Signed)
Quick Note:  LDL goal < 160. Pt states taking med nightly. Despite statin use, LDL increased from 129 2 yrs ago to 166 today. I am afraid if I remove the statin, her LDL will increase even more drastically. Therefore, stay on statin. Pt will be notified of this via phone. ______

## 2010-08-30 NOTE — Telephone Encounter (Signed)
I called pt and let her know [facility]  I also informed her of blood results

## 2010-08-30 NOTE — Telephone Encounter (Signed)
Pt had called.  Left message for Dr Rogelia Boga about the name of her pain patch which is Flector Topical Patch  1.3% 10cmx14cm    1 patch q12hrs.  Telephone # [phone].

## 2010-09-07 ENCOUNTER — Ambulatory Visit (HOSPITAL_COMMUNITY)
Admission: RE | Admit: 2010-09-07 | Discharge: 2010-09-07 | Disposition: A | Discharge status: Home / Self Care | Payer: Medicare Other | Source: Ambulatory Visit | Attending: Internal Medicine | Admitting: Internal Medicine

## 2010-09-07 DIAGNOSIS — R61 Generalized hyperhidrosis: Secondary | ICD-10-CM

## 2010-09-07 DIAGNOSIS — Z1231 Encounter for screening mammogram for malignant neoplasm of breast: Secondary | ICD-10-CM | POA: Insufficient documentation

## 2010-09-08 ENCOUNTER — Other Ambulatory Visit: Payer: Medicare Other

## 2010-09-08 LAB — HEMOCCULT GUIAC POC 1CARD (OFFICE)
Card #2 Fecal Occult Blod, POC: NEGATIVE
Card #3 Fecal Occult Blood, POC: NEGATIVE

## 2010-09-12 ENCOUNTER — Encounter: Payer: Self-pay | Admitting: Family Medicine

## 2010-09-18 LAB — LIPID PANEL
HDL: 46 mg/dL (ref >39)
LDL Cholesterol: 156 mg/dL — ABNORMAL HIGH (ref 0–99)
Total CHOL/HDL Ratio: 4.8 RATIO
VLDL: 17 mg/dL (ref 0–40)

## 2010-09-18 LAB — CBC
HCT: 36 % (ref 36.0–46.0)
HCT: 37.8 % (ref 36.0–46.0)
Hemoglobin: 14.5 g/dL (ref 12.0–15.0)
MCHC: 33.3 g/dL (ref 30.0–36.0)
MCHC: 33.6 g/dL (ref 30.0–36.0)
MCV: 87.1 fL (ref 78.0–100.0)
MCV: 87.3 fL (ref 78.0–100.0)
MCV: 87.4 fL (ref 78.0–100.0)
Platelets: 150 10*3/uL (ref 150–400)
Platelets: 167 10*3/uL (ref 150–400)
RBC: 4.33 MIL/uL (ref 3.87–5.11)
RBC: 4.94 MIL/uL (ref 3.87–5.11)
RDW: 14.2 % (ref 11.5–15.5)
WBC: 4.8 10*3/uL (ref 4.0–10.5)
WBC: 5.3 10*3/uL (ref 4.0–10.5)
WBC: 7.3 10*3/uL (ref 4.0–10.5)

## 2010-09-18 LAB — URINALYSIS, ROUTINE W REFLEX MICROSCOPIC
Nitrite: NEGATIVE
Protein, ur: NEGATIVE mg/dL
Specific Gravity, Urine: 1.015 (ref 1.005–1.030)
Urobilinogen, UA: 1 mg/dL (ref 0.0–1.0)

## 2010-09-18 LAB — URINE MICROSCOPIC-ADD ON

## 2010-09-18 LAB — POCT CARDIAC MARKERS
CKMB, poc: <1 ng/mL — ABNORMAL LOW (ref 1.0–8.0)
CKMB, poc: <1 ng/mL — ABNORMAL LOW (ref 1.0–8.0)
Myoglobin, poc: 60.4 ng/mL (ref 12–200)
Myoglobin, poc: 73.3 ng/mL (ref 12–200)
Troponin i, poc: <0.05 ng/mL (ref 0.00–0.09)

## 2010-09-18 LAB — BASIC METABOLIC PANEL
BUN: 6 mg/dL (ref 6–23)
BUN: 9 mg/dL (ref 6–23)
CO2: 24 mEq/L (ref 19–32)
Calcium: 9.3 mg/dL (ref 8.4–10.5)
Calcium: 10 mg/dL (ref 8.4–10.5)
Chloride: 111 mEq/L (ref 96–112)
Chloride: 111 mEq/L (ref 96–112)
Chloride: 113 mEq/L — ABNORMAL HIGH (ref 96–112)
Creatinine, Ser: 0.87 mg/dL (ref 0.4–1.2)
Creatinine, Ser: 0.95 mg/dL (ref 0.4–1.2)
Creatinine, Ser: 0.97 mg/dL (ref 0.4–1.2)
GFR calc Af Amer: >60 mL/min (ref >60)
GFR calc Af Amer: >60 mL/min (ref >60)
GFR calc Af Amer: >60 mL/min (ref >60)
GFR calc non Af Amer: 59 mL/min — ABNORMAL LOW (ref >60)
Potassium: 3.8 mEq/L (ref 3.5–5.1)
Potassium: 4.2 mEq/L (ref 3.5–5.1)
Sodium: 142 mEq/L (ref 135–145)

## 2010-09-18 LAB — CARDIAC PANEL(CRET KIN+CKTOT+MB+TROPI)
Relative Index: INVALID (ref 0.0–2.5)
Total CK: 51 U/L (ref 7–177)
Troponin I: <0.01 ng/mL (ref 0.00–0.06)
Troponin I: 0.01 ng/mL (ref 0.00–0.06)

## 2010-09-18 LAB — DIFFERENTIAL
Lymphs Abs: 1.9 10*3/uL (ref 0.7–4.0)
Monocytes Absolute: 0.4 10*3/uL (ref 0.1–1.0)
Monocytes Relative: 5 % (ref 3–12)
Neutro Abs: 4.9 10*3/uL (ref 1.7–7.7)
Neutrophils Relative %: 67 % (ref 43–77)

## 2010-09-18 LAB — HEPATIC FUNCTION PANEL
ALT: 12 U/L (ref 0–35)
AST: 16 U/L (ref 0–37)
Bilirubin, Direct: <0.1 mg/dL (ref 0.0–0.3)
Total Bilirubin: 0.4 mg/dL (ref 0.3–1.2)

## 2010-09-18 LAB — VITAMIN B12: Vitamin B-12: 171 pg/mL — ABNORMAL LOW (ref 211–911)

## 2010-09-18 LAB — HEMOGLOBIN A1C: Hgb A1c MFr Bld: 5.6 % (ref <5.7)

## 2010-09-19 NOTE — Miscellaneous (Signed)
Summary: TENS unit  Clinical Lists Changes filled out and faxed request for TENS medical [phone] fax  Appended Document: TENS unit faxed to [facility]

## 2010-09-27 ENCOUNTER — Ambulatory Visit (INDEPENDENT_AMBULATORY_CARE_PROVIDER_SITE_OTHER): Payer: Medicare Other | Admitting: *Deleted

## 2010-09-27 DIAGNOSIS — E538 Deficiency of other specified B group vitamins: Secondary | ICD-10-CM

## 2010-10-16 LAB — COMPREHENSIVE METABOLIC PANEL
ALT: 18 U/L (ref 0–35)
Albumin: 4.1 g/dL (ref 3.5–5.2)
Alkaline Phosphatase: 109 U/L (ref 39–117)
Potassium: 3.5 mEq/L (ref 3.5–5.1)
Sodium: 142 mEq/L (ref 135–145)
Total Protein: 6.7 g/dL (ref 6.0–8.3)

## 2010-10-16 LAB — DIFFERENTIAL
Basophils Absolute: 0 10*3/uL (ref 0.0–0.1)
Basophils Relative: 0 % (ref 0–1)
Monocytes Relative: 1 % — ABNORMAL LOW (ref 3–12)
Neutro Abs: 12.4 10*3/uL — ABNORMAL HIGH (ref 1.7–7.7)
Neutrophils Relative %: 91 % — ABNORMAL HIGH (ref 43–77)

## 2010-10-16 LAB — CBC
MCHC: 33.6 g/dL (ref 30.0–36.0)
RBC: 5.3 MIL/uL — ABNORMAL HIGH (ref 3.87–5.11)
RDW: 13.2 % (ref 11.5–15.5)

## 2010-10-16 LAB — POCT CARDIAC MARKERS
CKMB, poc: <1 ng/mL — ABNORMAL LOW (ref 1.0–8.0)
Troponin i, poc: <0.05 ng/mL (ref 0.00–0.09)
Troponin i, poc: <0.05 ng/mL (ref 0.00–0.09)

## 2010-10-24 ENCOUNTER — Ambulatory Visit (INDEPENDENT_AMBULATORY_CARE_PROVIDER_SITE_OTHER): Payer: Medicare Other | Admitting: Internal Medicine

## 2010-10-24 ENCOUNTER — Encounter: Payer: Self-pay | Admitting: Internal Medicine

## 2010-10-24 VITALS — BP 145/83 | HR 66 | Temp 98.0°F | Ht 69.0 in | Wt 214.5 lb

## 2010-10-24 DIAGNOSIS — R61 Generalized hyperhidrosis: Secondary | ICD-10-CM

## 2010-10-24 DIAGNOSIS — J302 Other seasonal allergic rhinitis: Secondary | ICD-10-CM

## 2010-10-24 DIAGNOSIS — M25511 Pain in right shoulder: Secondary | ICD-10-CM

## 2010-10-24 DIAGNOSIS — E785 Hyperlipidemia, unspecified: Secondary | ICD-10-CM

## 2010-10-24 DIAGNOSIS — E538 Deficiency of other specified B group vitamins: Secondary | ICD-10-CM

## 2010-10-24 DIAGNOSIS — G8929 Other chronic pain: Secondary | ICD-10-CM

## 2010-10-24 DIAGNOSIS — M25519 Pain in unspecified shoulder: Secondary | ICD-10-CM

## 2010-10-24 DIAGNOSIS — R29898 Other symptoms and signs involving the musculoskeletal system: Secondary | ICD-10-CM

## 2010-10-24 DIAGNOSIS — I10 Essential (primary) hypertension: Secondary | ICD-10-CM

## 2010-10-24 DIAGNOSIS — J309 Allergic rhinitis, unspecified: Secondary | ICD-10-CM

## 2010-10-24 MED ORDER — CYANOCOBALAMIN 1000 MCG/ML IJ SOLN
1000.0000 ug | Freq: Once | INTRAMUSCULAR | Status: AC
  Administered 2010-10-24: 1000 ug via INTRAMUSCULAR

## 2010-10-24 MED ORDER — SIMVASTATIN 20 MG PO TABS: 20.0000 mg | ORAL_TABLET | Freq: Every day | ORAL | Status: DC

## 2010-10-24 MED ORDER — AZITHROMYCIN 1 % OP SOLN: 1.0000 [drp] | Freq: Every day | OPHTHALMIC | Status: AC | PRN

## 2010-10-24 MED ORDER — OLOPATADINE HCL 0.1 % OP SOLN: 1.0000 [drp] | Freq: Two times a day (BID) | OPHTHALMIC | Status: DC

## 2010-10-24 NOTE — Progress Notes (Signed)
  Subjective:    Patient ID: Jessica Conrad, female    DOB: January 11, 1946, 65 y.o.   MRN: [id number]  HPI  Please see the A&P for the status of the pt's chronic medical problems.   Review of Systems  Constitutional: Positive for diaphoresis. Negative for fever, chills, activity change, appetite change and unexpected weight change.  HENT: Negative for rhinorrhea, sneezing and sinus pressure.   Eyes: Negative for pain.  Respiratory: Negative for cough and shortness of breath.   Cardiovascular: Negative for chest pain.  Gastrointestinal: Negative for diarrhea, constipation and blood in stool.  Musculoskeletal:       There is tenderness diffusely over the right shoulder including the deltoid. She is only able to abstract the right upper extremity about 45. She is married no children I rotate the shoulder. She has 4/5 weakness diffusely over the right upper extremity. There is no muscle wasting. Sensation is intact and equal.  Skin: Negative for rash and wound.  Neurological: Negative for dizziness and light-headedness.  Psychiatric/Behavioral: Negative for sleep disturbance.       Objective:   Physical Exam        Assessment & Plan:

## 2010-10-24 NOTE — Assessment & Plan Note (Signed)
Her CBC, CMP, TSH, and stool cards were all normal. I neglected to get a chest x-ray several bouts today and hopefully she can get it on the same day as her MRI. I reviewed up-to-date article on night sweats and they recommended PPD, CT of the chest, HIV, and a bone marrow. The PPD is an interesting thought however I think at this point the others aren't that aggressive. She has no signs or symptoms of infection as she is afebrile and her white blood cell count is normal.

## 2010-10-24 NOTE — Assessment & Plan Note (Addendum)
She has had right shoulder pain for 2 years and it is progressing to include numbness and weakness of whole arm now. Pain is worst over the deltoid. Sxs worse with lifting and sleep. Pain pills dull pain but she doesn't like taking them. PT helped briefly and the TENS unit helped but was not allowed to keep it after PT completed per her insurance company. No preceding injury. March 2011 Xray showed degenerative changes - no other imaging so far. Cortisone shot no help (was given by sports med I think). Pain is sharp pain with movement but also a constant dull ache. She is dropping things from her R hand bc of weakness. She is R handed..  [facility] on the pain in her left foot and ankle. She was prescribed an ankle brace when she went to urgent care and it does help but she is only able to wear it with a tennis shoe. It also helps her to walk. She would have surgery if they recommended that only if it would help the pain.

## 2010-10-24 NOTE — Assessment & Plan Note (Addendum)
Claritin doesn't help. Nose sprays make nose bleed. Allegra helps but can't afford. She will use the remaining Allegra that she has. When she runs out she will not be able to afford it.

## 2010-10-24 NOTE — Assessment & Plan Note (Signed)
She was getting her B12 injection today.

## 2010-10-24 NOTE — Assessment & Plan Note (Signed)
Jessica Conrad blood pressure has been better controlled. I reviewed her blood pressures over the past 6 months and most were well controlled. I did not adjust her medications today.

## 2010-10-26 ENCOUNTER — Telehealth: Payer: Self-pay | Admitting: *Deleted

## 2010-10-26 MED ORDER — LORAZEPAM 1 MG PO TABS: ORAL_TABLET | ORAL | Status: AC

## 2010-10-26 NOTE — Telephone Encounter (Signed)
Call from pt.  She's requesting Advil 200mg   2 tablets every 8hrs PRN for arthritis pain be added to her current med list,which she buys OTC.  She needs a new copy of all her meds mail to her in order to re-new her housing contract which needs to  includel Advil. States she pays $17-$18 for a large bottle.

## 2010-10-26 NOTE — Telephone Encounter (Signed)
I added IBU to her med list. Would you also pls call in her Ativan - preMRI med. Thanks!

## 2010-10-27 NOTE — Telephone Encounter (Signed)
[facility]; pt was called and made awared.  Also medication list mailed to pt.

## 2010-10-30 ENCOUNTER — Ambulatory Visit (HOSPITAL_COMMUNITY)
Admission: RE | Admit: 2010-10-30 | Discharge: 2010-10-30 | Disposition: A | Discharge status: Home / Self Care | Payer: Medicare Other | Source: Ambulatory Visit | Attending: Internal Medicine | Admitting: Internal Medicine

## 2010-10-30 ENCOUNTER — Encounter: Payer: Self-pay | Admitting: Internal Medicine

## 2010-10-30 DIAGNOSIS — R61 Generalized hyperhidrosis: Secondary | ICD-10-CM

## 2010-10-30 DIAGNOSIS — M719 Bursopathy, unspecified: Secondary | ICD-10-CM | POA: Insufficient documentation

## 2010-10-30 DIAGNOSIS — M19019 Primary osteoarthritis, unspecified shoulder: Secondary | ICD-10-CM | POA: Insufficient documentation

## 2010-10-30 DIAGNOSIS — R29898 Other symptoms and signs involving the musculoskeletal system: Secondary | ICD-10-CM

## 2010-10-30 DIAGNOSIS — G8929 Other chronic pain: Secondary | ICD-10-CM

## 2010-10-30 DIAGNOSIS — D481 Neoplasm of uncertain behavior of connective and other soft tissue: Secondary | ICD-10-CM | POA: Insufficient documentation

## 2010-10-30 DIAGNOSIS — M25519 Pain in unspecified shoulder: Secondary | ICD-10-CM | POA: Insufficient documentation

## 2010-10-30 DIAGNOSIS — M25511 Pain in right shoulder: Secondary | ICD-10-CM

## 2010-10-30 DIAGNOSIS — S46819A Strain of other muscles, fascia and tendons at shoulder and upper arm level, unspecified arm, initial encounter: Secondary | ICD-10-CM | POA: Insufficient documentation

## 2010-10-30 DIAGNOSIS — M6281 Muscle weakness (generalized): Secondary | ICD-10-CM | POA: Insufficient documentation

## 2010-10-30 DIAGNOSIS — M67919 Unspecified disorder of synovium and tendon, unspecified shoulder: Secondary | ICD-10-CM | POA: Insufficient documentation

## 2010-10-30 DIAGNOSIS — D4819 Other specified neoplasm of uncertain behavior of connective and other soft tissue: Secondary | ICD-10-CM | POA: Insufficient documentation

## 2010-10-30 DIAGNOSIS — X58XXXA Exposure to other specified factors, initial encounter: Secondary | ICD-10-CM | POA: Insufficient documentation

## 2010-10-30 NOTE — Progress Notes (Signed)
Addended by: Blanch Media on: 10/30/2010 08:59 AM   Modules accepted: Orders

## 2010-10-30 NOTE — Progress Notes (Signed)
Addended by: Blanch Media on: 10/30/2010 11:23 AM   Modules accepted: Orders

## 2010-11-06 ENCOUNTER — Encounter: Payer: Self-pay | Admitting: Internal Medicine

## 2010-11-08 ENCOUNTER — Telehealth: Payer: Self-pay | Admitting: Internal Medicine

## 2010-11-08 NOTE — Telephone Encounter (Signed)
I RECEIVED A MEDICAL CLEARANCE FROM [facility] ON 5-7-012 . AT THE TOP OF PAGE THE FAX PRINTED DATE OF 5-6-012 FROM THEIR OFFICE. I CALLED DR BUTCHER TO INFORM HER OF THE FORM. SHE WOULD NOT BE ABLE TO GET TO IT UNTIL Tuesday 5-8-012.  Tuesday DR BUTCHER DID A LETTER AND FILLED OUT CLEARANCE. I SPOKE WITH DONNA AT THE OFFICE.  PAPER WORK WAS FAXED TO THE OFFICE THAT MORNING 5-8-012. FORM WAS RECEIVED ONE DAY BEFORE THE SURGERY.  CHILION SAID THAT MS Kennerly CALLED AND WAS  VERY NASTY TO HER ON THE PHONE AND THAT WE WAS THE REASON WE HELD UP HER SURGERY.  I CALLED PATIENT TODAY , SOMEONE PICKED UP THE PHONE AND WOULD NOT SPEAK AND HUNG UP THE PHONE.  I SPOKE WITH JIM (MY DIRECTOR) INFORMED HIM OF WHAT HAPPENED. I CALLED AND SPOKE WITH DR Midwife AND INFORMED HER OF THIS ALSO. I DID SPEAK WITH MS Martha'S DAUGHTER (MS Soldo WAS ON HER WAY OUT TO THE CAR) AND APOLOGIZED , LETTING HER KNOW THAT WE DID NOT GET THE FORM UNTIL Monday MORNING,AND THE PAPER WORK HAS BEEN FAXED TO THE OFFICE. Charline Bills CALLED MS Hyer THIS EVENING AND LEFT A VOICE MESSAGE FOR HER TO CALL ME BACK. HAVE NOT HEARD FROM HER. LELA STURDIVANT NTII

## 2010-11-09 NOTE — Telephone Encounter (Signed)
Rec'd phone call from patient yesterday afternoon.  Patient was very upset that the [facility] did not handle her paperwork for her Foot Surgery in a timely manner.  Patient stated she was very angry with Dr. Rogelia Boga for holding her surgery up by not completing her surgical clearance letter.  Patient stated that she will find herself another Doctor and wishes not to use this clinic anymore.

## 2010-11-10 ENCOUNTER — Ambulatory Visit: Payer: Medicare Other | Admitting: Family Medicine

## 2010-11-13 ENCOUNTER — Encounter: Payer: Self-pay | Admitting: Internal Medicine

## 2010-11-14 ENCOUNTER — Telehealth: Payer: Self-pay | Admitting: *Deleted

## 2010-11-14 NOTE — Telephone Encounter (Signed)
GOT A CALL FROM MS Jessica Conrad, YESTERDAY REQUESTING TO CHANGE HER DOCTOR, SHE DID NOT HAVE HER SURGERY. I INSTRUCTED MS Jessica Conrad TO CALL AND SPEAK WITH OUR OFFICE MANAGER MS DORIS (DORIS SOLOMON).AND THAT I HATED THAT SHE WAS NOT HAPPY WITH HER DR.. SHE SAID THAT SHE AND HER DR, DID NOT CLICK FROM THE START. TOLD HER TO MAKE SURE SHE CALL THE OFFICE MANAGER.

## 2010-11-22 ENCOUNTER — Ambulatory Visit (INDEPENDENT_AMBULATORY_CARE_PROVIDER_SITE_OTHER): Payer: Medicare Other | Admitting: *Deleted

## 2010-11-22 DIAGNOSIS — E538 Deficiency of other specified B group vitamins: Secondary | ICD-10-CM

## 2010-11-22 MED ORDER — CYANOCOBALAMIN 1000 MCG/ML IJ SOLN
1000.0000 ug | Freq: Once | INTRAMUSCULAR | Status: AC
  Administered 2010-11-22: 1000 ug via INTRAMUSCULAR

## 2010-12-13 ENCOUNTER — Encounter: Payer: Self-pay | Admitting: Internal Medicine

## 2010-12-25 ENCOUNTER — Other Ambulatory Visit: Payer: Self-pay | Admitting: Internal Medicine

## 2010-12-25 ENCOUNTER — Ambulatory Visit (INDEPENDENT_AMBULATORY_CARE_PROVIDER_SITE_OTHER): Payer: Medicare Other | Admitting: *Deleted

## 2010-12-25 DIAGNOSIS — E538 Deficiency of other specified B group vitamins: Secondary | ICD-10-CM

## 2010-12-25 MED ORDER — CYANOCOBALAMIN 1000 MCG/ML IJ SOLN
1000.0000 ug | Freq: Once | INTRAMUSCULAR | Status: AC
  Administered 2010-12-25: 1000 ug via INTRAMUSCULAR

## 2010-12-25 MED ORDER — CYANOCOBALAMIN 1000 MCG/ML IJ SOLN: 1000.0000 ug | INTRAMUSCULAR | Status: AC

## 2010-12-26 ENCOUNTER — Ambulatory Visit (INDEPENDENT_AMBULATORY_CARE_PROVIDER_SITE_OTHER): Payer: Medicare Other | Admitting: Internal Medicine

## 2010-12-26 ENCOUNTER — Telehealth: Payer: Self-pay | Admitting: *Deleted

## 2010-12-26 ENCOUNTER — Encounter: Payer: Self-pay | Admitting: Internal Medicine

## 2010-12-26 DIAGNOSIS — I341 Nonrheumatic mitral (valve) prolapse: Secondary | ICD-10-CM

## 2010-12-26 DIAGNOSIS — I059 Rheumatic mitral valve disease, unspecified: Secondary | ICD-10-CM

## 2010-12-26 DIAGNOSIS — E538 Deficiency of other specified B group vitamins: Secondary | ICD-10-CM

## 2010-12-26 LAB — CBC
HCT: 44 % (ref 36.0–46.0)
MCH: 28.4 pg (ref 26.0–34.0)
MCV: 86.8 fL (ref 78.0–100.0)
Platelets: 216 10*3/uL (ref 150–400)
RBC: 5.07 MIL/uL (ref 3.87–5.11)

## 2010-12-26 NOTE — Progress Notes (Signed)
  Subjective:    Patient ID: Jessica Conrad, female    DOB: Jan 19, 1946, 65 y.o.   MRN: [id number]  HPI Please see the A&P for the status of the pt's chronic medical problems.     Review of Systems  Constitutional: Negative for fever, chills, diaphoresis, activity change and fatigue.  HENT: Positive for dental problem. Negative for sore throat, facial swelling, trouble swallowing and neck pain.   Respiratory: Negative for shortness of breath.   Cardiovascular: Negative for chest pain.       Objective:   Physical Exam VItal signs reviewed and stable. GEN: No apparent distress.  Alert and oriented x 3.  Pleasant, conversant, and cooperative to exam. HEENT: head is autraumatic and normocephalic.  Mucous membranes are moist.  Oropharynx is without erythema, exudates, or other abnormal lesions.  Dentition is poor with numerous teeth missing. RESP:  Lungs are clear to ascultation bilaterally with good air movement.  No wheezes, ronchi, or rubs. CARDIOVASCULAR: regular rate, normal rhythm.  Clear S1, S2, 3/6 systolic murmur noted, no gallops gallops, or rubs.         Assessment & Plan:

## 2010-12-26 NOTE — Telephone Encounter (Signed)
Pt is scheduled for dental surgery (extractions) on 7/3. She wants to be seen before then to see if she needs antibiotics first. Tooth is decayed and needs extractions.

## 2010-12-26 NOTE — Assessment & Plan Note (Signed)
Patient reports history of mitral valve prolapse was diagnosed when she was approximately 65 years old.  She presents today or prophylactic antibiotics for upcoming dental work.  Review literature reveals that, as of 2007, the American Heart Association no longer recommend prophylactic antibiotic therapy for patients with mitral valve prolapse.  This was discussed with the patient; she will not receive any prophylactic antibiotics today. She is advised to call the clinic if her dentist insists on antibiotic therapy.  Review of the electronic medical record (E-chart) reveals that she had a 2-D echocardiogram in February 2005; at that time she had an EF of 55-65% and a normal appearing mitral valve, including normal excursion of valvular leaflets.  Her echo showed trivial tricuspid regurg as well as mild calcification of the aortic valve but was otherwise without any abnormal finding.  I am unsure she truly has mitral valve prolapse.

## 2010-12-26 NOTE — Patient Instructions (Signed)
Please schedule a followup appointment with her primary care provider, Dr. Rogelia Boga, in 3-4 months. We will check your B12 level today. You do not need antibiotics for dental work.  If your dentist feels like you will need extensive dental work and will require antibiotics, please call the clinic at [phone]. You saw Dr. Arvilla Market today.

## 2010-12-26 NOTE — Assessment & Plan Note (Signed)
Patient received her monthly B12 injection earlier this week.  Will check a CBC and B12 level today.  She would like to discontinue monthly B12 injections; she dislikes needles and would prefer oral therapy if possible.

## 2010-12-26 NOTE — Telephone Encounter (Signed)
Appointment give for today

## 2011-01-02 ENCOUNTER — Other Ambulatory Visit: Payer: Self-pay | Admitting: Internal Medicine

## 2011-01-02 NOTE — Telephone Encounter (Signed)
I believe that must be Rx'd by optho as not on my med list and not a med I Rx. Could you please ask her to contact her optho to refill

## 2011-01-04 NOTE — Telephone Encounter (Signed)
Pt informed

## 2011-09-24 ENCOUNTER — Other Ambulatory Visit: Payer: Self-pay | Admitting: Internal Medicine

## 2011-09-24 NOTE — Telephone Encounter (Signed)
i called pt, she states she is seeing [facility] x 2-3 months and will not come back to [facility]. i informed her that the proair would be refused here and will inform pharm to send request to her new provider.

## 2011-09-24 NOTE — Telephone Encounter (Signed)
The last that I heard, pt had fired me as her PCP. This was over a pre-op issue. Pt has never been back to see me. She has three options 1. Cont to use me as her PCP. I will refill med and she can make appt 2. Transfer care to another clinic MD knowing that there is a chance that she will have to see me at some point, either as inpt of output 3. Transfer care to another clinic.

## 2011-10-31 ENCOUNTER — Emergency Department (HOSPITAL_COMMUNITY): Payer: Medicare Other

## 2011-10-31 ENCOUNTER — Emergency Department (HOSPITAL_COMMUNITY)
Admission: EM | Admit: 2011-10-31 | Discharge: 2011-10-31 | Disposition: A | Discharge status: Home / Self Care | Payer: Medicare Other | Attending: Emergency Medicine | Admitting: Emergency Medicine

## 2011-10-31 ENCOUNTER — Encounter (HOSPITAL_COMMUNITY): Payer: Self-pay | Admitting: *Deleted

## 2011-10-31 DIAGNOSIS — S0990XA Unspecified injury of head, initial encounter: Secondary | ICD-10-CM | POA: Insufficient documentation

## 2011-10-31 DIAGNOSIS — Z86718 Personal history of other venous thrombosis and embolism: Secondary | ICD-10-CM | POA: Insufficient documentation

## 2011-10-31 DIAGNOSIS — E785 Hyperlipidemia, unspecified: Secondary | ICD-10-CM | POA: Insufficient documentation

## 2011-10-31 DIAGNOSIS — R079 Chest pain, unspecified: Secondary | ICD-10-CM | POA: Insufficient documentation

## 2011-10-31 DIAGNOSIS — S60219A Contusion of unspecified wrist, initial encounter: Secondary | ICD-10-CM | POA: Insufficient documentation

## 2011-10-31 DIAGNOSIS — G8929 Other chronic pain: Secondary | ICD-10-CM | POA: Insufficient documentation

## 2011-10-31 DIAGNOSIS — M25531 Pain in right wrist: Secondary | ICD-10-CM

## 2011-10-31 DIAGNOSIS — Y92009 Unspecified place in unspecified non-institutional (private) residence as the place of occurrence of the external cause: Secondary | ICD-10-CM

## 2011-10-31 DIAGNOSIS — M25569 Pain in unspecified knee: Secondary | ICD-10-CM | POA: Insufficient documentation

## 2011-10-31 DIAGNOSIS — Z7982 Long term (current) use of aspirin: Secondary | ICD-10-CM | POA: Insufficient documentation

## 2011-10-31 DIAGNOSIS — W108XXA Fall (on) (from) other stairs and steps, initial encounter: Secondary | ICD-10-CM | POA: Insufficient documentation

## 2011-10-31 DIAGNOSIS — Z79899 Other long term (current) drug therapy: Secondary | ICD-10-CM | POA: Insufficient documentation

## 2011-10-31 DIAGNOSIS — M25539 Pain in unspecified wrist: Secondary | ICD-10-CM | POA: Insufficient documentation

## 2011-10-31 DIAGNOSIS — R0781 Pleurodynia: Secondary | ICD-10-CM

## 2011-10-31 MED ORDER — IBUPROFEN 200 MG PO TABS: 400.0000 mg | ORAL_TABLET | Freq: Once | ORAL | Status: DC

## 2011-10-31 MED ORDER — TRAMADOL HCL 50 MG PO TABS: 50.0000 mg | ORAL_TABLET | Freq: Four times a day (QID) | ORAL | Status: AC | PRN

## 2011-10-31 NOTE — ED Notes (Signed)
Pt fell down 2 steps and struck the left side of her face.  Pt states that she had a brief LOC with this.  No bleeding or hematoma noted (slight skin redness under left eye).  Pt also has some pain in left wrist.  Pt denies any dizziness or nausea or blurred vision since fall and was able to drive herself here.  Ambulatory and alert and oriented in triage

## 2011-10-31 NOTE — Discharge Instructions (Signed)
Concussion and Brain Injury A blow or jolt to the head can disrupt the normal function of the brain. This type of brain injury is often called a "concussion" or a "closed head injury." Concussions are usually not life-threatening. Even so, the effects of a concussion can be serious.  CAUSES  A concussion is caused by a blunt blow to the head. The blow might be direct or indirect as described below.  Direct blow (running into another player during a soccer game, being hit in a fight, or hitting your head on a hard surface).   Indirect blow (when your head moves rapidly and violently back and forth like in a car crash).  SYMPTOMS  The brain is very complex. Every head injury is different. Some symptoms may appear right away. Other symptoms may not show up for days or weeks after the concussion. The signs of concussion can be hard to notice. Early on, problems may be missed by patients, family members, and caregivers. You may look fine even though you are acting or feeling differently.  These symptoms are usually temporary, but may last for days, weeks, or even longer. Symptoms include:  Mild headaches that will not go away.   Having more trouble than usual with:   Remembering things.   Paying attention or concentrating.   Organizing daily tasks.   Making decisions and solving problems.   Slowness in thinking, acting, speaking, or reading.   Getting lost or easily confused.   Feeling tired all the time or lacking energy (fatigue).   Feeling drowsy.   Sleep disturbances.   Sleeping more than usual.   Sleeping less than usual.   Trouble falling asleep.   Trouble sleeping (insomnia).   Loss of balance or feeling lightheaded or dizzy.   Nausea or vomiting.   Numbness or tingling.   Increased sensitivity to:   Sounds.   Lights.   Distractions.  Other symptoms might include:  Vision problems or eyes that tire easily.   Diminished sense of taste or smell.   Ringing  in the ears.   Mood changes such as feeling sad, anxious, or listless.   Becoming easily irritated or angry for little or no reason.   Lack of motivation.  DIAGNOSIS  Your caregiver can usually diagnose a concussion or mild brain injury based on your description of your injury and your symptoms.  Your evaluation might include:  A brain scan to look for signs of injury to the brain. Even if the test shows no injury, you may still have a concussion.   Blood tests to be sure other problems are not present.  TREATMENT   People with a concussion need to be examined and evaluated. Most people with concussions are treated in an emergency department, urgent care, or clinic. Some people must stay in the hospital overnight for further treatment.   Your caregiver will send you home with important instructions to follow. Be sure to carefully follow them.   Tell your caregiver if you are already taking any medicines (prescription, over-the-counter, or natural remedies), or if you are drinking alcohol or taking illegal drugs. Also, talk with your caregiver if you are taking blood thinners (anticoagulants) or aspirin. These drugs may increase your chances of complications. All of this is important information that may affect treatment.   Only take over-the-counter or prescription medicines for pain, discomfort, or fever as directed by your caregiver.  PROGNOSIS  How fast people recover from brain injury varies from person to person.   Although most people have a good recovery, how quickly they improve depends on many factors. These factors include how severe their concussion was, what part of the brain was injured, their age, and how healthy they were before the concussion.  Because all head injuries are different, so is recovery. Most people with mild injuries recover fully. Recovery can take time. In general, recovery is slower in older persons. Also, persons who have had a concussion in the past or have  other medical problems may find that it takes longer to recover from their current injury. Anxiety and depression may also make it harder to adjust to the symptoms of brain injury. HOME CARE INSTRUCTIONS  Return to your normal activities slowly, not all at once. You must give your body and brain enough time for recovery.  Get plenty of sleep at night, and rest during the day. Rest helps the brain to heal.   Avoid staying up late at night.   Keep the same bedtime hours on weekends and weekdays.   Take daytime naps or rest breaks when you feel tired.   Limit activities that require a lot of thought or concentration (brain or cognitive rest). This includes:   Homework or job-related work.   Watching TV.   Computer work.   Avoid activities that could lead to a second brain injury, such as contact or recreational sports, until your caregiver says it is okay. Even after your brain injury has healed, you should protect yourself from having another concussion.   Ask your caregiver when you can return to your normal activities such as driving, bicycling, or operating heavy equipment. Your ability to react may be slower after a brain injury.   Talk with your caregiver about when you can return to work or school.   Inform your teachers, school nurse, school counselor, coach, athletic trainer, or work manager about your injury, symptoms, and restrictions. They should be instructed to report:   Increased problems with attention or concentration.   Increased problems remembering or learning new information.   Increased time needed to complete tasks or assignments.   Increased irritability or decreased ability to cope with stress.   Increased symptoms.   Take only those medicines that your caregiver has approved.   Do not drink alcohol until your caregiver says you are well enough to do so. Alcohol and certain other drugs may slow your recovery and can put you at risk of further injury.    If it is harder than usual to remember things, write them down.   If you are easily distracted, try to do one thing at a time. For example, do not try to watch TV while fixing dinner.   Talk with family members or close friends when making important decisions.   Keep all follow-up appointments. Repeated evaluation of your symptoms is recommended for your recovery.  PREVENTION  Protect your head from future injury. It is very important to avoid another head or brain injury before you have recovered. In rare cases, another injury has lead to permanent brain damage, brain swelling, or death. Avoid injuries by using:  Seatbelts when riding in a car.   Alcohol only in moderation.   A helmet when biking, skiing, skateboarding, skating, or doing similar activities.   Safety measures in your home.   Remove clutter and tripping hazards from floors and stairways.   Use grab bars in bathrooms and handrails by stairs.   Place non-slip mats on floors and in bathtubs.     Improve lighting in dim areas.  SEEK MEDICAL CARE IF:  A head injury can cause lingering symptoms. You should seek medical care if you have any of the following symptoms for more than 3 weeks after your injury or are planning to return to sports:  Chronic headaches.   Dizziness or balance problems.   Nausea.   Vision problems.   Increased sensitivity to noise or light.   Depression or mood swings.   Anxiety or irritability.   Memory problems.   Difficulty concentrating or paying attention.   Sleep problems.   Feeling tired all the time.  SEEK IMMEDIATE MEDICAL CARE IF:  You have had a blow or jolt to the head and you (or your family or friends) notice:  Severe or worsening headaches.   Weakness (even if only in one hand or one leg or one part of the face), numbness, or decreased coordination.   Repeated vomiting.   Increased sleepiness or passing out.   One black center of the eye (pupil) is larger  than the other.   Convulsions (seizures).   Slurred speech.   Increasing confusion, restlessness, agitation, or irritability.   Lack of ability to recognize people or places.   Neck pain.   Difficulty being awakened.   Unusual behavior changes.   Loss of consciousness.  Older adults with a brain injury may have a higher risk of serious complications such as a blood clot on the brain. Headaches that get worse or an increase in confusion are signs of this complication. If these signs occur, see a caregiver right away. MAKE SURE YOU:   Understand these instructions.   Will watch your condition.   Will get help right away if you are not doing well or get worse.  FOR MORE INFORMATION  Several groups help people with brain injury and their families. They provide information and put people in touch with local resources. These include support groups, rehabilitation services, and a variety of health care professionals. Among these groups, the Brain Injury Association (BIA, www.biausa.org) has a national office that gathers scientific and educational information and works on a national level to help people with brain injury.  Document Released: 09/08/2003 Document Revised: 06/07/2011 Document Reviewed: 02/04/2008 [employer] Patient Information 2012 [employer]. 

## 2011-10-31 NOTE — ED Provider Notes (Signed)
History   This chart was scribed for Cyndra Numbers, MD by Melba Coon. The patient was seen in room STRE6/STRE6 and the patient's care was started at 11:12AM.    CSN: [id number]  Arrival date & time 10/31/11  1033   First MD Initiated Contact with Patient 10/31/11 1103      Chief Complaint  Patient presents with  . Fall    (Consider location/radiation/quality/duration/timing/severity/associated sxs/prior treatment) HPI Jessica Conrad is a 65 y.o. female who presents to the Emergency Department complaining of constant, moderate left wrist pain with an onset this morning pertaining to a fall down the stairs (2 steps); LOC present with head contact on the left side of the face. Pt states her left knee gave out; has happened in the past. Pt is now ambulatory. No bleeding or hematoma on head. Patient does take daily aspirin. She also complains of right lower rib pain that is worse when she takes a deep breath. This was not present prior to trauma. No dizziness or blurred vision. No HA, fever, neck pain, sore throat, rash, back pain, CP, SOB, abd pain, n/v/d, dysuria, or extremity weakness, numbness, or tingling. Allergic to amoxicillin, ASA, and penicillin. No other pertinent medical symptoms.  Past Medical History  Diagnosis Date  . History of pulmonary embolism 1983    Hx PE 1983. Hx DVT of L LE 1985, 1987, and 1989.  . Osteopenia 2010    DEXA 6/10 T lumbar spine -2.2 to -2.3  . Hyperlipidemia     Maintaned on statin  . Vitamin B 12 deficiency 2010    Dx and started tx 07/2008. B 12 decreased to 171 May 2011.  Marland Kitchen Chronic pain     OA R hip - has been evaluated by ortho. Trochanteric bursitis s/p steroid injections. L foot pain 2/2 old fracture.  . Seasonal allergies   . Chest pain, atypical 2011    Myoview negative May 2011. EF 63%.    Past Surgical History  Procedure Date  . Appendectomy   . Tonsillectomy   . Nasal sinus surgery   . Abdominal hysterectomy     Also oophorectomy      Family History  Problem Relation Age of Onset  . Heart disease Father 84    Required surgery  . Stroke Mother 34    Fractured hip, CVA's as complication  . Arthritis Mother   . Diabetes Sister   . Diabetes Sister     History  Substance Use Topics  . Smoking status: Never Smoker   . Smokeless tobacco: Never Used  . Alcohol Use: No    OB History    Grav Para Term Preterm Abortions TAB SAB Ect Mult Living                  Review of Systems 10 Systems reviewed and all are negative for acute change except as noted in the HPI.   Allergies  Amoxicillin; Aspirin; and Penicillins  Home Medications   Current Outpatient Rx  Name Route Sig Dispense Refill  . ASPIRIN 81 MG PO TBEC Oral Take 1 tablet (81 mg total) by mouth daily. 90 tablet 3  . DICLOFENAC EPOLAMINE 1.3 % TD PTCH Transdermal Place 1 patch onto the skin every 12 (twelve) hours as needed. 60 patch 11  . ESOMEPRAZOLE MAGNESIUM 20 MG PO CPDR Oral Take 1 capsule (20 mg total) by mouth at bedtime. 90 capsule 3  . IBUPROFEN 200 MG PO TABS Oral Take 400 mg by mouth  every 8 (eight) hours as needed. For pain     . METOPROLOL SUCCINATE ER 25 MG PO TB24 Oral Take 1 tablet (25 mg total) by mouth daily. 90 tablet 3  . OLOPATADINE HCL 0.1 % OP SOLN Both Eyes Place 1 drop into both eyes 2 (two) times daily. 15 mL 3  . SIMVASTATIN 20 MG PO TABS Oral Take 1 tablet (20 mg total) by mouth at bedtime. 90 tablet 3  . ZOLPIDEM TARTRATE 10 MG PO TABS Oral Take 1 tablet (10 mg total) by mouth at bedtime as needed. 90 tablet 3    BP 163/87  Pulse 50  Temp(Src) 97.7 F (36.5 C) (Oral)  Resp 16  SpO2 98%  LMP 10/24/1978  Physical Exam  Nursing note and vitals reviewed. Constitutional: She is oriented to person, place, and time. She appears well-developed and well-nourished. No distress.  HENT:  Head: Normocephalic and atraumatic.  Eyes: EOM are normal.  Neck: Neck supple. No tracheal deviation present.  Cardiovascular: Normal  rate.   Pulmonary/Chest: Effort normal. No respiratory distress. She exhibits tenderness (ttp rt anterior lower ribs).  Musculoskeletal: Normal range of motion.       Ecchymosis on vulvar surface of rt wrist w/o deformity  bilateral knees ttp; no ligamentous laxity or deformity  Neurological: She is alert and oriented to person, place, and time.  Skin: Skin is warm and dry. There is erythema (Erythema under left eye).  Psychiatric: She has a normal mood and affect. Her behavior is normal.    ED Course  Procedures (including critical care time)  DIAGNOSTIC STUDIES: Oxygen Saturation is 98% on room air, normal by my interpretation.    COORDINATION OF CARE:  11:17AM - EDMD will order CXR, XR wrist and head CT for the pt. 12:23PM - recheck; EDM reviewed imaging results which were negative; EDMD will order ibuprofen here, and prescribe tramadol for home; pt ready for d/c   Labs Reviewed - No data to display Dg Chest 2 View  10/31/2011  *RADIOLOGY REPORT*  Clinical Data: History of injury from fall.  Pain in the right side of the chest.  CHEST - 2 VIEW  Comparison: 10/30/2010.  Findings: Cardiac silhouette is upper limits of normal size. Ectasia and tortuosity of thoracic aorta are seen.  Mediastinal and hilar contours appear stable.  No pneumothorax or pleural effusion is seen.  No pulmonary infiltrates or nodules were evident.  No fracture is evident.  There is minimal degenerative spondylosis.  IMPRESSION: No cardiopulmonary or pleural abnormality is evident.  No pneumothorax or fracture is evident.  Original Report Authenticated By: Crawford Givens, M.D.   Dg Wrist Complete Right  10/31/2011  *RADIOLOGY REPORT*  Clinical Data: Anterior wrist pain post fall  RIGHT WRIST - COMPLETE 3+ VIEW  Comparison: None  Findings: Osseous demineralization. Radiocarpal joint degenerative changes and joint space narrowing. Probable distal radioulnar joint degenerative changes with small calcified body. Abutment of  the distal ulna and lunate with associated subchondral sclerosis. Questionable erosion in proximal hamate at mid carpal joint. No definite acute fracture, dislocation, or bone destruction.  IMPRESSION: Osseous demineralization with radiocarpal and probable distal radioulnar joint degenerative changes. No acute abnormalities. Potential erosion at the hamate, could reflect an inflammatory arthropathy or gout at the mid carpal joint.  Original Report Authenticated By: Lollie Marrow, M.D.   Ct Head Wo Contrast  10/31/2011  *RADIOLOGY REPORT*  Clinical Data: Fall.  Head injury.  Loss of consciousness.  CT HEAD WITHOUT CONTRAST  Technique:  Contiguous axial images were obtained from the base of the skull through the vertex without contrast.  Comparison: None.  Findings: There is no evidence for acute hemorrhage, hydrocephalus, mass lesion, or abnormal extra-axial fluid collection.  No definite CT evidence for acute infarction.  The visualized paranasal sinuses and mastoid air cells are clear.  IMPRESSION: No acute intracranial abnormality.  Original Report Authenticated By: ERIC A. MANSELL, M.D.     1. Fall at home   2. Rib pain on right side   3. Wrist pain, acute, right   4. Minor head injury       MDM  Discussed study results with patient. She had workup for a fall resulting from some weakness in her knee. This has happened previously. Patient did not have syncope. She thinks she may have had slight loss of consciousness. Head was imaged and was negative. Patient was given ibuprofen for her symptoms here as she is driving. She was discharged with prescription for tramadol. She can followup with her primary care physician as needed. She was not given an incentive spirometer but I did instruct her on breathing exercises to perform at home to prevent development of atelectasis. Patient was discharged in good condition.  I personally performed the services described in this documentation, which was scribed  in my presence. The recorded information has been reviewed and considered.      Cyndra Numbers, MD 10/31/11 1243

## 2011-11-16 ENCOUNTER — Other Ambulatory Visit: Payer: Self-pay | Admitting: Internal Medicine

## 2011-11-16 NOTE — Telephone Encounter (Signed)
According to 09/24/11 note, patient has transferred her care to another practice.

## 2012-03-05 ENCOUNTER — Emergency Department (INDEPENDENT_AMBULATORY_CARE_PROVIDER_SITE_OTHER)
Admission: EM | Admit: 2012-03-05 | Discharge: 2012-03-05 | Disposition: A | Discharge status: Home / Self Care | Payer: Medicare Other | Source: Home / Self Care | Attending: Emergency Medicine | Admitting: Emergency Medicine

## 2012-03-05 ENCOUNTER — Other Ambulatory Visit: Payer: Self-pay

## 2012-03-05 ENCOUNTER — Emergency Department (INDEPENDENT_AMBULATORY_CARE_PROVIDER_SITE_OTHER): Payer: Medicare Other

## 2012-03-05 ENCOUNTER — Encounter (HOSPITAL_COMMUNITY): Payer: Self-pay | Admitting: *Deleted

## 2012-03-05 DIAGNOSIS — T148XXA Other injury of unspecified body region, initial encounter: Secondary | ICD-10-CM

## 2012-03-05 DIAGNOSIS — R079 Chest pain, unspecified: Secondary | ICD-10-CM

## 2012-03-05 HISTORY — DX: Gastro-esophageal reflux disease without esophagitis: K21.9

## 2012-03-05 HISTORY — DX: Pure hypercholesterolemia, unspecified: E78.00

## 2012-03-05 HISTORY — DX: Essential (primary) hypertension: I10

## 2012-03-05 LAB — CBC WITH DIFFERENTIAL/PLATELET
Lymphocytes Relative: 45 % (ref 12–46)
Lymphs Abs: 2.6 10*3/uL (ref 0.7–4.0)
Neutro Abs: 2.7 10*3/uL (ref 1.7–7.7)
Neutrophils Relative %: 46 % (ref 43–77)
Platelets: 200 10*3/uL (ref 150–400)
RBC: 4.8 MIL/uL (ref 3.87–5.11)
WBC: 5.8 10*3/uL (ref 4.0–10.5)

## 2012-03-05 LAB — APTT: aPTT: 29 seconds (ref 24–37)

## 2012-03-05 MED ORDER — NITROGLYCERIN 0.4 MG SL SUBL: 0.4000 mg | SUBLINGUAL_TABLET | SUBLINGUAL | Status: AC | PRN

## 2012-03-05 MED ORDER — TRIAMCINOLONE ACETONIDE 0.1 % EX CREA: TOPICAL_CREAM | Freq: Three times a day (TID) | CUTANEOUS | Status: AC

## 2012-03-05 NOTE — ED Notes (Signed)
Pt is here with complaints of left arm hematoma and chest "heaviness."  Pt states she had an episode of chest pain on Thursday and then developed a left arm hematoma the following day.  Pt reports residual chest heaviness.  Large bruising/hematoma noted to left arm without any known injury.  Pt does take daily ASA for history of blood clots.

## 2012-03-05 NOTE — ED Provider Notes (Signed)
Chief Complaint  Patient presents with  . Chest Pain    History of Present Illness:   Jessica Conrad is a 66 year old female who had an episode of chest pain 6 days ago. This occurred in the morning, shortly after getting up. She was sitting, eating her breakfast. The pain was substernal and radiated through the back but not into the neck or down the arm. It lasted 15-20 minutes. It felt like something was sitting on her chest. She felt tired and short of breath with the pain but denies any nausea or diaphoresis. She states she has a constant heavy feeling in her chest. She also has a history of reflux esophagitis and takes Nexium daily, but did not notice any waterbrash or burping with pain. The pain has not recurred since then. She has had no similar episodes of chest pain prior to that. She was hospitalized at [facility] 3-4 years ago with chest pain and underwent extensive workup including treadmill testing but not a cardiac cath. She also saw a cardiologist and had a negative outpatient workup as well. She's not been in to see the cardiologist for 3 or 4 years. She has no known history of cardiac disease.  The following day, 5 days ago, she noted a small bruise on her left upper arm initially this was very small and minimally painful. Then the bruise began to enlarge and encompassed the entire upper arm. Now it is moderately painful. She is able to move her shoulder and elbow. She denies any trauma, bites, or stings. It should be noted that when she had the chest pain she took an aspirin. She denies any other bleeding from her nose, and her urine, or stool, or any other bruising.  Review of Systems:  Other than noted above, the patient denies any of the following symptoms. Systemic:  No fever, chills, sweats, or fatigue. ENT:  No nasal congestion, rhinorrhea, or sore throat. Pulmonary:  No cough, wheezing, shortness of breath, sputum production, hemoptysis. Cardiac:  No palpitations, rapid heartbeat,  dizziness, presyncope or syncope. GI:  No abdominal pain, heartburn, nausea, or vomiting. Ext:  No leg pain or swelling.  PMFSH:  Past medical history, family history, social history, meds, and allergies were reviewed and updated as needed.   Physical Exam:   Vital signs:  BP 145/81  Pulse 67  Temp 97.8 F (36.6 C) (Oral)  Resp 20  SpO2 99%  LMP 10/24/1978 Gen:  Alert, oriented, in no distress, skin warm and dry. Eye:  PERRL, lids and conjunctivas normal.  Sclera non-icteric. ENT:  Mucous membranes moist, pharynx clear. Neck:  Supple, no adenopathy or tenderness.  No JVD. Lungs:  Clear to auscultation, no wheezes, rales or rhonchi.  No respiratory distress. Heart:  Regular rhythm.  No gallops, murmers, clicks or rubs. Chest:  No chest wall tenderness. Abdomen:  Soft, nontender, no organomegaly or mass.  Bowel sounds normal.  No pulsatile abdominal mass or bruit. Ext:  No edema.  No calf tenderness and Homann's sign negative.  Pulses full and equal. Skin:  Warm and dry.  No rash. There is a large hematoma on the left upper arm which extends from the shoulder to the elbow and is minimally painful. She also has a tiny erythematous papule on her left CVA area which she states is very itchy and she thinks might be mosquito bite.         Labs:   Results for orders placed during the hospital encounter of 03/05/12  CBC WITH DIFFERENTIAL      Component Value Range   WBC 5.8  4.0 - 10.5 K/uL   RBC 4.80  3.87 - 5.11 MIL/uL   Hemoglobin 13.5  12.0 - 15.0 g/dL   HCT 16.1  09.6 - 04.5 %   MCV 85.2  78.0 - 100.0 fL   MCH 28.1  26.0 - 34.0 pg   MCHC 33.0  30.0 - 36.0 g/dL   RDW 40.9  81.1 - 91.4 %   Platelets 200  150 - 400 K/uL   Neutrophils Relative 46  43 - 77 %   Neutro Abs 2.7  1.7 - 7.7 K/uL   Lymphocytes Relative 45  12 - 46 %   Lymphs Abs 2.6  0.7 - 4.0 K/uL   Monocytes Relative 7  3 - 12 %   Monocytes Absolute 0.4  0.1 - 1.0 K/uL   Eosinophils Relative 2  0 - 5 %    Eosinophils Absolute 0.1  0.0 - 0.7 K/uL   Basophils Relative 1  0 - 1 %   Basophils Absolute 0.0  0.0 - 0.1 K/uL  APTT      Component Value Range   aPTT 29  24 - 37 seconds     Radiology:  Dg Chest 2 View  03/05/2012  *RADIOLOGY REPORT*  Clinical Data: Chest pain.  Left upper arm hematoma.  Cough.  CHEST - 2 VIEW  Comparison: 10/31/2011.  Findings: Trachea is midline.  Heart size within normal limits. Lungs are clear.  No pleural fluid.  IMPRESSION: No acute findings.   Original Report Authenticated By: Reyes Ivan, M.D.     EKG:   Date: 03/05/2012  Rate: 59  Rhythm: sinus bradycardia  QRS Axis: normal  Intervals: normal  ST/T Wave abnormalities: normal  Conduction Disutrbances:none  Narrative Interpretation: Sinus bradycardia, otherwise normal EKG.  Old EKG Reviewed: none available  Assessment:  The primary encounter diagnosis was Chest pain. A diagnosis of Hematoma was also pertinent to this visit. Her chest pain may be cardiac or may be due to reflux. She was given a prescription for nitroglycerin since she has been pain free for the past 6 days. She was instructed on how to use it and I advised that she get back in to see her cardiologist within the next week. I think the hematoma is just a spontaneously ruptured blood vessel, possibly due to aspirin use and I have advised that she abstain from aspirin until this is cleared up. She was also given a small tube of triamcinolone for the small, pruritic papule on her left upper back.  Plan:   1.  The following meds were prescribed:   New Prescriptions   NITROGLYCERIN (NITROSTAT) 0.4 MG SL TABLET    Place 1 tablet (0.4 mg total) under the tongue every 5 (five) minutes as needed for chest pain.   TRIAMCINOLONE CREAM (KENALOG) 0.1 %    Apply topically 3 (three) times daily.   2.  The patient was instructed in symptomatic care and handouts were given. 3.  The patient was told to return if becoming worse in any way, if no better in 3  or 4 days, and given some red flag symptoms that would indicate earlier return.  Follow up:  The patient was told to follow up with Dr. Garnette Scheuermann or Dr. Marca Ancona within the next week for cardiac evaluation.  The patient was told that the differential diagnosis includes heart disease, and that it was of  the utmost importance to followup with the specialist to whom she was referred.      Reuben Likes, MD 03/05/12 1346

## 2012-03-27 ENCOUNTER — Encounter (HOSPITAL_COMMUNITY): Payer: Self-pay | Admitting: Emergency Medicine

## 2012-03-27 ENCOUNTER — Emergency Department (INDEPENDENT_AMBULATORY_CARE_PROVIDER_SITE_OTHER)
Admission: EM | Admit: 2012-03-27 | Discharge: 2012-03-27 | Disposition: A | Discharge status: Home / Self Care | Payer: Medicare Other | Source: Home / Self Care | Attending: Emergency Medicine | Admitting: Emergency Medicine

## 2012-03-27 DIAGNOSIS — H81399 Other peripheral vertigo, unspecified ear: Secondary | ICD-10-CM

## 2012-03-27 MED ORDER — MECLIZINE HCL 12.5 MG PO TABS: 12.5000 mg | ORAL_TABLET | Freq: Three times a day (TID) | ORAL | Status: DC | PRN

## 2012-03-27 NOTE — ED Provider Notes (Signed)
History     CSN: [id number]  Arrival date & time 03/27/12  1737   First MD Initiated Contact with Patient 03/27/12 1741      Chief Complaint  Patient presents with  . Dizziness    (Consider location/radiation/quality/duration/timing/severity/associated sxs/prior treatment) HPI Comments: Patient presents to urgent care complaining of new onset of "spinning sensation", it all started this morning when he woke up since then have had this sensation of things spinning around me when I move suddenly. If I stay still or in a sitting position the symptoms seem to go away. "As I had something similar happen to me in 1971". Patient denies any headaches, numbness or tingling sensations or weakness of any upper or lower extremities, denies any headaches, fevers nausea or vomiting.  On further interview patient also denies tinnitus, nausea vomiting headaches or otalgia. Patient denies any other associated symptoms such as chest pains, shortness of breath palpitations.  Patient is a 66 y.o. female presenting with neurologic complaint. The history is provided by the patient.  Neurologic Problem The primary symptoms include dizziness and paresthesias. Primary symptoms do not include headaches, syncope, loss of consciousness, altered mental status, seizures, visual change, focal weakness, loss of sensation, speech change, memory loss, fever, nausea or vomiting. The symptoms began 12 to 24 hours ago. The symptoms are improving. The neurological symptoms are focal.  Dizziness does not occur with tinnitus, nausea, vomiting, weakness or diaphoresis.  Additional symptoms do not include neck stiffness, weakness, pain, lower back pain, leg pain, loss of balance, photophobia, aura, hallucinations, nystagmus, taste disturbance, hyperacusis, hearing loss, tinnitus, vertigo, anxiety, irritability or dysphoric mood. Workup history does not include MRI.    Past Medical History  Diagnosis Date  . History of pulmonary  embolism 1983    Hx PE 1983. Hx DVT of L LE 1985, 1987, and 1989.  . Osteopenia 2010    DEXA 6/10 T lumbar spine -2.2 to -2.3  . Hyperlipidemia     Maintaned on statin  . Vitamin B 12 deficiency 2010    Dx and started tx 07/2008. B 12 decreased to 171 May 2011.  Marland Kitchen Chronic pain     OA R hip - has been evaluated by ortho. Trochanteric bursitis s/p steroid injections. L foot pain 2/2 old fracture.  . Seasonal allergies   . Chest pain, atypical 2011    Myoview negative May 2011. EF 63%.  . Hypertension   . High cholesterol   . GERD (gastroesophageal reflux disease)     Past Surgical History  Procedure Date  . Appendectomy   . Tonsillectomy   . Nasal sinus surgery   . Abdominal hysterectomy     Also oophorectomy    Family History  Problem Relation Age of Onset  . Heart disease Father 83    Required surgery  . Stroke Mother 47    Fractured hip, CVA's as complication  . Arthritis Mother   . Diabetes Sister   . Diabetes Sister     History  Substance Use Topics  . Smoking status: Never Smoker   . Smokeless tobacco: Never Used  . Alcohol Use: No    OB History    Grav Para Term Preterm Abortions TAB SAB Ect Mult Living                  Review of Systems  Constitutional: Negative for fever, chills, diaphoresis, activity change, appetite change and irritability.  HENT: Negative for hearing loss, ear pain, neck pain, neck stiffness  and tinnitus.   Eyes: Negative for photophobia and visual disturbance.  Respiratory: Negative for cough and shortness of breath.   Cardiovascular: Negative for chest pain, palpitations, leg swelling and syncope.  Gastrointestinal: Negative for nausea and vomiting.  Skin: Negative for pallor.  Neurological: Positive for dizziness and paresthesias. Negative for vertigo, speech change, focal weakness, seizures, loss of consciousness, syncope, facial asymmetry, speech difficulty, weakness, light-headedness, numbness, headaches and loss of balance.    Psychiatric/Behavioral: Negative for hallucinations, memory loss, dysphoric mood and altered mental status.    Allergies  Amoxicillin; Aspirin; and Penicillins  Home Medications   Current Outpatient Rx  Name Route Sig Dispense Refill  . ESOMEPRAZOLE MAGNESIUM 20 MG PO CPDR Oral Take 1 capsule (20 mg total) by mouth at bedtime. 90 capsule 3  . ZOLPIDEM TARTRATE 10 MG PO TABS Oral Take 1 tablet (10 mg total) by mouth at bedtime as needed. 90 tablet 3  . ASPIRIN 81 MG PO TBEC Oral Take 1 tablet (81 mg total) by mouth daily. 90 tablet 3  . IBUPROFEN 200 MG PO TABS Oral Take 400 mg by mouth every 8 (eight) hours as needed. For pain     . MECLIZINE HCL 12.5 MG PO TABS Oral Take 1 tablet (12.5 mg total) by mouth 3 (three) times daily as needed. 15 tablet 0  . METOPROLOL SUCCINATE ER 25 MG PO TB24 Oral Take 1 tablet (25 mg total) by mouth daily. 90 tablet 3  . NITROGLYCERIN 0.4 MG SL SUBL Sublingual Place 1 tablet (0.4 mg total) under the tongue every 5 (five) minutes as needed for chest pain. 30 tablet 0  . OLOPATADINE HCL 0.1 % OP SOLN Both Eyes Place 1 drop into both eyes 2 (two) times daily. 15 mL 3  . SIMVASTATIN 20 MG PO TABS Oral Take 1 tablet (20 mg total) by mouth at bedtime. 90 tablet 3  . TRIAMCINOLONE ACETONIDE 0.1 % EX CREA Topical Apply topically 3 (three) times daily. 30 g 0    BP 151/61  Pulse 64  Temp 97.9 F (36.6 C) (Oral)  Resp 18  SpO2 100%  LMP 10/24/1978  Physical Exam  Nursing note and vitals reviewed. Constitutional: She is oriented to person, place, and time. Vital signs are normal. She appears well-developed and well-nourished.  Non-toxic appearance. She does not have a sickly appearance. She does not appear ill. No distress.  HENT:  Head: Normocephalic.  Eyes: Conjunctivae normal and EOM are normal. Pupils are equal, round, and reactive to light.  Neck: Normal range of motion. Neck supple. No JVD present.  Cardiovascular: Normal rate, regular rhythm, normal  heart sounds and normal pulses.  Exam reveals no gallop and no friction rub.   No murmur heard. Pulmonary/Chest: Effort normal and breath sounds normal.  Neurological: She is alert and oriented to person, place, and time. She has normal strength. She displays no tremor. No cranial nerve deficit or sensory deficit. She exhibits normal muscle tone. Coordination and gait normal.  Skin: Skin is warm. No rash noted.    ED Course  Procedures (including critical care time)  Labs Reviewed - No data to display No results found.   1. Peripheral vertigo     Rhythm strip and 30 seconds illustrated although patient was observed for approximately 2 minutes and no arrhythmias were observed and are pulse also related between 55-62 beats per minute  MDM   Symptoms and exam were mostly consistent with a peripheral positional vertigo patient is afebrile and with  no further neurological symptoms and no cardiovascular coexistent symptomatology. Patient has been encouraged to do a trial of meclizine with abundant fluids and discussed symptoms that should warrant further evaluation in the emergency department. Patient agree with treatment plan and suspects that this is a vertigo symptom related to positional changes as she is not expressing any other symptoms. She agrees with treatment plan followup care and will go to the emergency department if any red flag symptoms as we had discussed.       Jimmie Molly, MD 03/27/12 2133

## 2012-03-27 NOTE — ED Notes (Signed)
Pt c/o dizziness that started today. She states its worse when she walks around. Pt states she just feels foggy. Pt denies nausea, vomiting, or diarrhea, she states she has been eating and drinking normally.

## 2012-11-06 ENCOUNTER — Other Ambulatory Visit: Payer: Self-pay | Admitting: Otolaryngology

## 2012-11-07 ENCOUNTER — Other Ambulatory Visit: Payer: Self-pay | Admitting: Physician Assistant

## 2012-11-07 DIAGNOSIS — I6523 Occlusion and stenosis of bilateral carotid arteries: Secondary | ICD-10-CM

## 2012-11-12 ENCOUNTER — Ambulatory Visit
Admission: RE | Admit: 2012-11-12 | Discharge: 2012-11-12 | Disposition: A | Discharge status: Home / Self Care | Payer: Medicare Other | Source: Ambulatory Visit | Attending: Physician Assistant | Admitting: Physician Assistant

## 2012-11-12 DIAGNOSIS — I6523 Occlusion and stenosis of bilateral carotid arteries: Secondary | ICD-10-CM

## 2013-05-09 ENCOUNTER — Encounter (HOSPITAL_COMMUNITY): Payer: Self-pay | Admitting: Emergency Medicine

## 2013-05-09 ENCOUNTER — Emergency Department (INDEPENDENT_AMBULATORY_CARE_PROVIDER_SITE_OTHER)
Admission: EM | Admit: 2013-05-09 | Discharge: 2013-05-09 | Disposition: A | Discharge status: Home / Self Care | Payer: Medicare Other | Source: Home / Self Care | Attending: Family Medicine | Admitting: Family Medicine

## 2013-05-09 DIAGNOSIS — H00036 Abscess of eyelid left eye, unspecified eyelid: Secondary | ICD-10-CM

## 2013-05-09 DIAGNOSIS — H00039 Abscess of eyelid unspecified eye, unspecified eyelid: Secondary | ICD-10-CM

## 2013-05-09 MED ORDER — CEPHALEXIN 500 MG PO CAPS: 500.0000 mg | ORAL_CAPSULE | Freq: Three times a day (TID) | ORAL | Status: DC

## 2013-05-09 MED ORDER — MOXIFLOXACIN HCL 0.5 % OP SOLN: 1.0000 [drp] | Freq: Three times a day (TID) | OPHTHALMIC | Status: DC

## 2013-05-09 NOTE — ED Provider Notes (Signed)
CSN: [id number]     Arrival date & time 05/09/13  0902 History   First MD Initiated Contact with Patient 05/09/13 [phone]     Chief Complaint  Patient presents with  . Eye Problem   (Consider location/radiation/quality/duration/timing/severity/associated sxs/prior Treatment) Patient is a 67 y.o. female presenting with eye problem. The history is provided by the patient.  Eye Problem Location:  L eye Quality:  Dull and aching Severity:  Mild Onset quality:  Gradual Duration:  3 days Progression:  Unchanged Chronicity:  New Relieved by:  None tried Associated symptoms: crusting, discharge and redness   Associated symptoms: no blurred vision, no foreign body sensation, no itching and no photophobia     Past Medical History  Diagnosis Date  . History of pulmonary embolism 1983    Hx PE 1983. Hx DVT of L LE 1985, 1987, and 1989.  . Osteopenia 2010    DEXA 6/10 T lumbar spine -2.2 to -2.3  . Hyperlipidemia     Maintaned on statin  . Vitamin B 12 deficiency 2010    Dx and started tx 07/2008. B 12 decreased to 171 May 2011.  Marland Kitchen Chronic pain     OA R hip - has been evaluated by ortho. Trochanteric bursitis s/p steroid injections. L foot pain 2/2 old fracture.  . Seasonal allergies   . Chest pain, atypical 2011    Myoview negative May 2011. EF 63%.  . Hypertension   . High cholesterol   . GERD (gastroesophageal reflux disease)    Past Surgical History  Procedure Laterality Date  . Appendectomy    . Tonsillectomy    . Nasal sinus surgery    . Abdominal hysterectomy      Also oophorectomy   Family History  Problem Relation Age of Onset  . Heart disease Father 26    Required surgery  . Stroke Mother 49    Fractured hip, CVA's as complication  . Arthritis Mother   . Diabetes Sister   . Diabetes Sister    History  Substance Use Topics  . Smoking status: Never Smoker   . Smokeless tobacco: Never Used  . Alcohol Use: No   OB History   Grav Para Term Preterm Abortions TAB  SAB Ect Mult Living                 Review of Systems  Constitutional: Negative.   HENT: Positive for facial swelling. Negative for postnasal drip and rhinorrhea.   Eyes: Positive for discharge and redness. Negative for blurred vision, photophobia, pain, itching and visual disturbance.    Allergies  Amoxicillin; Aspirin; and Penicillins  Home Medications   Current Outpatient Rx  Name  Route  Sig  Dispense  Refill  . aspirin 81 MG EC tablet   Oral   Take 1 tablet (81 mg total) by mouth daily.   90 tablet   3   . cephALEXin (KEFLEX) 500 MG capsule   Oral   Take 1 capsule (500 mg total) by mouth 3 (three) times daily. Take all of medicine and drink lots of fluids   21 capsule   0   . esomeprazole (NEXIUM) 20 MG capsule   Oral   Take 1 capsule (20 mg total) by mouth at bedtime.   90 capsule   3   . ibuprofen (ADVIL,MOTRIN) 200 MG tablet   Oral   Take 400 mg by mouth every 8 (eight) hours as needed. For pain          .  meclizine (ANTIVERT) 12.5 MG tablet   Oral   Take 1 tablet (12.5 mg total) by mouth 3 (three) times daily as needed.   15 tablet   0   . metoprolol succinate (TOPROL-XL) 25 MG 24 hr tablet   Oral   Take 1 tablet (25 mg total) by mouth daily.   90 tablet   3   . moxifloxacin (VIGAMOX) 0.5 % ophthalmic solution   Left Eye   Place 1 drop into the left eye 3 (three) times daily. After warm soak to eye.   3 mL   0   . EXPIRED: nitroGLYCERIN (NITROSTAT) 0.4 MG SL tablet   Sublingual   Place 1 tablet (0.4 mg total) under the tongue every 5 (five) minutes as needed for chest pain.   30 tablet   0   . olopatadine (PATANOL) 0.1 % ophthalmic solution   Both Eyes   Place 1 drop into both eyes 2 (two) times daily.   15 mL   3   . simvastatin (ZOCOR) 20 MG tablet   Oral   Take 1 tablet (20 mg total) by mouth at bedtime.   90 tablet   3   . zolpidem (AMBIEN) 10 MG tablet   Oral   Take 1 tablet (10 mg total) by mouth at bedtime as needed.    90 tablet   3    BP 139/78  Pulse 65  Temp(Src) 98 F (36.7 C) (Oral)  Resp 18  SpO2 98%  LMP 10/24/1978 Physical Exam  Nursing note and vitals reviewed. Constitutional: She appears well-developed and well-nourished.  HENT:  Head: Normocephalic.  Mouth/Throat: Oropharynx is clear and moist.  Eyes: Conjunctivae and EOM are normal. Pupils are equal, round, and reactive to light. Left eye exhibits discharge.      ED Course  Procedures (including critical care time) Labs Review Labs Reviewed - No data to display Imaging Review No results found.  EKG Interpretation     Ventricular Rate:    PR Interval:    QRS Duration:   QT Interval:    QTC Calculation:   R Axis:     Text Interpretation:              MDM      Linna Hoff, MD 05/09/13 [phone]

## 2013-05-09 NOTE — ED Notes (Signed)
Pt c/o swelling under left eye onset 3 days Sxs include: eye drainage/watery, pain Denies: inj/trauma, blurry vision, f/v/n/d Also c/o of mass on left side of jaw that's tender Alert w/no signs of acute distress.

## 2015-04-27 ENCOUNTER — Encounter (HOSPITAL_COMMUNITY): Payer: Self-pay

## 2015-04-27 ENCOUNTER — Emergency Department (HOSPITAL_COMMUNITY): Payer: Commercial Managed Care - HMO

## 2015-04-27 ENCOUNTER — Emergency Department (HOSPITAL_COMMUNITY)
Admission: EM | Admit: 2015-04-27 | Discharge: 2015-04-27 | Disposition: A | Discharge status: Home / Self Care | Payer: Commercial Managed Care - HMO | Attending: Emergency Medicine | Admitting: Emergency Medicine

## 2015-04-27 DIAGNOSIS — Z79899 Other long term (current) drug therapy: Secondary | ICD-10-CM | POA: Diagnosis not present

## 2015-04-27 DIAGNOSIS — R1031 Right lower quadrant pain: Secondary | ICD-10-CM | POA: Diagnosis present

## 2015-04-27 DIAGNOSIS — E785 Hyperlipidemia, unspecified: Secondary | ICD-10-CM | POA: Insufficient documentation

## 2015-04-27 DIAGNOSIS — I10 Essential (primary) hypertension: Secondary | ICD-10-CM | POA: Insufficient documentation

## 2015-04-27 DIAGNOSIS — Z792 Long term (current) use of antibiotics: Secondary | ICD-10-CM | POA: Insufficient documentation

## 2015-04-27 DIAGNOSIS — G8929 Other chronic pain: Secondary | ICD-10-CM | POA: Diagnosis not present

## 2015-04-27 DIAGNOSIS — K219 Gastro-esophageal reflux disease without esophagitis: Secondary | ICD-10-CM | POA: Diagnosis not present

## 2015-04-27 DIAGNOSIS — Z8739 Personal history of other diseases of the musculoskeletal system and connective tissue: Secondary | ICD-10-CM | POA: Insufficient documentation

## 2015-04-27 DIAGNOSIS — Z880 Allergy status to penicillin: Secondary | ICD-10-CM | POA: Insufficient documentation

## 2015-04-27 DIAGNOSIS — Z86711 Personal history of pulmonary embolism: Secondary | ICD-10-CM | POA: Diagnosis not present

## 2015-04-27 DIAGNOSIS — N390 Urinary tract infection, site not specified: Secondary | ICD-10-CM | POA: Insufficient documentation

## 2015-04-27 DIAGNOSIS — N200 Calculus of kidney: Secondary | ICD-10-CM

## 2015-04-27 DIAGNOSIS — E7800 Pure hypercholesterolemia, unspecified: Secondary | ICD-10-CM | POA: Insufficient documentation

## 2015-04-27 DIAGNOSIS — K5730 Diverticulosis of large intestine without perforation or abscess without bleeding: Secondary | ICD-10-CM | POA: Diagnosis not present

## 2015-04-27 DIAGNOSIS — R109 Unspecified abdominal pain: Secondary | ICD-10-CM

## 2015-04-27 DIAGNOSIS — R112 Nausea with vomiting, unspecified: Secondary | ICD-10-CM

## 2015-04-27 DIAGNOSIS — Z7982 Long term (current) use of aspirin: Secondary | ICD-10-CM | POA: Insufficient documentation

## 2015-04-27 DIAGNOSIS — N179 Acute kidney failure, unspecified: Secondary | ICD-10-CM | POA: Diagnosis not present

## 2015-04-27 LAB — CBC WITH DIFFERENTIAL/PLATELET
Basophils Absolute: 0.1 10*3/uL (ref 0.0–0.1)
Basophils Relative: 1 %
EOS ABS: 0.1 10*3/uL (ref 0.0–0.7)
EOS PCT: 1 %
HCT: 46.3 % — ABNORMAL HIGH (ref 36.0–46.0)
HEMOGLOBIN: 15.7 g/dL — AB (ref 12.0–15.0)
LYMPHS ABS: 2.3 10*3/uL (ref 0.7–4.0)
LYMPHS PCT: 24 %
MCH: 28.9 pg (ref 26.0–34.0)
MCHC: 33.9 g/dL (ref 30.0–36.0)
MCV: 85.3 fL (ref 78.0–100.0)
MONOS PCT: 6 %
Monocytes Absolute: 0.6 10*3/uL (ref 0.1–1.0)
NEUTROS PCT: 68 %
Neutro Abs: 6.4 10*3/uL (ref 1.7–7.7)
Platelets: 205 10*3/uL (ref 150–400)
RBC: 5.43 MIL/uL — AB (ref 3.87–5.11)
RDW: 13.7 % (ref 11.5–15.5)
WBC: 9.3 10*3/uL (ref 4.0–10.5)

## 2015-04-27 LAB — URINE MICROSCOPIC-ADD ON

## 2015-04-27 LAB — COMPREHENSIVE METABOLIC PANEL
ALK PHOS: 121 U/L (ref 38–126)
ALT: 19 U/L (ref 14–54)
ANION GAP: 14 (ref 5–15)
AST: 30 U/L (ref 15–41)
Albumin: 4.4 g/dL (ref 3.5–5.0)
BUN: 11 mg/dL (ref 6–20)
CALCIUM: 11 mg/dL — AB (ref 8.9–10.3)
CO2: 20 mmol/L — ABNORMAL LOW (ref 22–32)
CREATININE: 1.15 mg/dL — AB (ref 0.44–1.00)
Chloride: 108 mmol/L (ref 101–111)
GFR, EST AFRICAN AMERICAN: 54 mL/min — AB (ref >60)
GFR, EST NON AFRICAN AMERICAN: 47 mL/min — AB (ref >60)
Glucose, Bld: 121 mg/dL — ABNORMAL HIGH (ref 65–99)
Potassium: 3.5 mmol/L (ref 3.5–5.1)
Sodium: 142 mmol/L (ref 135–145)
TOTAL PROTEIN: 7.6 g/dL (ref 6.5–8.1)
Total Bilirubin: 0.7 mg/dL (ref 0.3–1.2)

## 2015-04-27 LAB — URINALYSIS, ROUTINE W REFLEX MICROSCOPIC
BILIRUBIN URINE: NEGATIVE
GLUCOSE, UA: NEGATIVE mg/dL
Ketones, ur: NEGATIVE mg/dL
Nitrite: NEGATIVE
PROTEIN: NEGATIVE mg/dL
Specific Gravity, Urine: 1.013 (ref 1.005–1.030)
UROBILINOGEN UA: 0.2 mg/dL (ref 0.0–1.0)
pH: 7.5 (ref 5.0–8.0)

## 2015-04-27 LAB — I-STAT TROPONIN, ED: Troponin i, poc: 0 ng/mL (ref 0.00–0.08)

## 2015-04-27 LAB — LIPASE, BLOOD: LIPASE: 25 U/L (ref 11–51)

## 2015-04-27 MED ORDER — MORPHINE SULFATE (PF) 4 MG/ML IV SOLN
4.0000 mg | Freq: Once | INTRAVENOUS | Status: AC
  Administered 2015-04-27: 4 mg via INTRAVENOUS
  Filled 2015-04-27: qty 1

## 2015-04-27 MED ORDER — DIPHENHYDRAMINE HCL 50 MG/ML IJ SOLN
25.0000 mg | Freq: Once | INTRAMUSCULAR | Status: AC
  Administered 2015-04-27: 25 mg via INTRAVENOUS
  Filled 2015-04-27: qty 1

## 2015-04-27 MED ORDER — HYDROCODONE-ACETAMINOPHEN 5-325 MG PO TABS: 1.0000 | ORAL_TABLET | Freq: Four times a day (QID) | ORAL | Status: AC | PRN

## 2015-04-27 MED ORDER — SODIUM CHLORIDE 0.9 % IV BOLUS (SEPSIS)
1000.0000 mL | Freq: Once | INTRAVENOUS | Status: AC
  Administered 2015-04-27: 1000 mL via INTRAVENOUS

## 2015-04-27 MED ORDER — ONDANSETRON HCL 4 MG/2ML IJ SOLN
4.0000 mg | Freq: Once | INTRAMUSCULAR | Status: AC
  Administered 2015-04-27: 4 mg via INTRAVENOUS
  Filled 2015-04-27: qty 2

## 2015-04-27 MED ORDER — METOCLOPRAMIDE HCL 5 MG/ML IJ SOLN
10.0000 mg | Freq: Once | INTRAMUSCULAR | Status: AC
  Administered 2015-04-27: 10 mg via INTRAVENOUS
  Filled 2015-04-27: qty 2

## 2015-04-27 MED ORDER — ONDANSETRON HCL 8 MG PO TABS: 8.0000 mg | ORAL_TABLET | Freq: Three times a day (TID) | ORAL | Status: AC | PRN

## 2015-04-27 MED ORDER — CEPHALEXIN 500 MG PO CAPS: 500.0000 mg | ORAL_CAPSULE | Freq: Three times a day (TID) | ORAL | Status: AC

## 2015-04-27 NOTE — ED Provider Notes (Signed)
CSN: [id number]     Arrival date & time 04/27/15  1022 History   First MD Initiated Contact with Patient 04/27/15 1034     Chief Complaint  Patient presents with  . Abdominal Pain     (Consider location/radiation/quality/duration/timing/severity/associated sxs/prior Treatment) HPI Comments: KAZIA GRISANTI is a 69 y.o. female with a PMHx of remote PE, osteopenia, HLD, vit B12 deficiency, chronic pain, atypical chest pain, HTN, HLD, and GERD, with a PSHx of appendectomy and abd hysterectomy, who presents to the ED with complaints of right-sided abdominal pain 1 day. She reports gradual onset of pain at around midnight last night, describing it as 10/10 constant pain from the right flank into the right upper and lower abdomen, unable to fully describe the pain aside from saying "it hurts", with no known rating factors, and minimally relieved with Advil. Associated symptoms include nausea and 1 episode of nonbloody nonbilious emesis prior to arrival. She denies any fevers, chills, chest pain, shortness breath, hematemesis, diarrhea, constipation, obstipation, melena, hematochezia, dysuria, hematuria, numbness, tingling, weakness, recent travel, sick contacts, suspicious food intake, or alcohol use. She endorses regular NSAID use due to her arthritis.  Patient is a 69 y.o. female presenting with abdominal pain. The history is provided by the patient. No language interpreter was used.  Abdominal Pain Pain location:  R flank, RLQ and RUQ Pain quality comment:  "it hurts" Pain radiates to:  RUQ and RLQ Pain severity:  Severe Onset quality:  Gradual Duration:  10 hours Timing:  Constant Progression:  Unchanged Chronicity:  New Context: not recent travel, not sick contacts and not suspicious food intake   Relieved by:  NSAIDs (minimal relief) Worsened by:  Nothing tried Ineffective treatments:  None tried Associated symptoms: nausea and vomiting   Associated symptoms: no chest pain, no chills,  no constipation, no diarrhea, no dysuria, no fever, no flatus, no hematemesis, no hematochezia, no hematuria, no melena and no shortness of breath   Risk factors: NSAID use   Risk factors: no alcohol abuse     Past Medical History  Diagnosis Date  . History of pulmonary embolism 1983    Hx PE 1983. Hx DVT of [place], and 1989.  . Osteopenia 2010    DEXA 6/10 T lumbar spine -2.2 to -2.3  . Hyperlipidemia     Maintaned on statin  . Vitamin B 12 deficiency 2010    Dx and started tx 07/2008. B 12 decreased to 171 May 2011.  Marland Kitchen Chronic pain     OA R hip - has been evaluated by ortho. Trochanteric bursitis s/p steroid injections. L foot pain 2/2 old fracture.  . Seasonal allergies   . Chest pain, atypical 2011    Myoview negative May 2011. EF 63%.  . Hypertension   . High cholesterol   . GERD (gastroesophageal reflux disease)    Past Surgical History  Procedure Laterality Date  . Appendectomy    . Tonsillectomy    . Nasal sinus surgery    . Abdominal hysterectomy      Also oophorectomy   Family History  Problem Relation Age of Onset  . Heart disease Father 19    Required surgery  . Stroke Mother 74    Fractured hip, CVA's as complication  . Arthritis Mother   . Diabetes Sister   . Diabetes Sister    Social History  Substance Use Topics  . Smoking status: Never Smoker   . Smokeless tobacco: Never Used  .  Alcohol Use: No   OB History    No data available     Review of Systems  Constitutional: Negative for fever and chills.  Respiratory: Negative for shortness of breath.   Cardiovascular: Negative for chest pain.  Gastrointestinal: Positive for nausea, vomiting and abdominal pain. Negative for diarrhea, constipation, blood in stool, melena, hematochezia, flatus and hematemesis.  Genitourinary: Positive for flank pain. Negative for dysuria and hematuria.  Musculoskeletal: Negative for myalgias and arthralgias.  Skin: Negative for color change.    Allergic/Immunologic: Negative for immunocompromised state.  Neurological: Negative for weakness and numbness.  Psychiatric/Behavioral: Negative for confusion.   10 Systems reviewed and are negative for acute change except as noted in the HPI.    Allergies  Amoxicillin; Aspirin; and Penicillins  Home Medications   Prior to Admission medications   Medication Sig Start Date End Date Taking? Authorizing Provider  aspirin 81 MG EC tablet Take 1 tablet (81 mg total) by mouth daily. 08/29/10   Bartholomew Crews, MD  cephALEXin (KEFLEX) 500 MG capsule Take 1 capsule (500 mg total) by mouth 3 (three) times daily. Take all of medicine and drink lots of fluids 05/09/13   Billy Fischer, MD  esomeprazole (NEXIUM) 20 MG capsule Take 1 capsule (20 mg total) by mouth at bedtime. 08/29/10   Bartholomew Crews, MD  ibuprofen (ADVIL,MOTRIN) 200 MG tablet Take 400 mg by mouth every 8 (eight) hours as needed. For pain     Historical Provider, MD  meclizine (ANTIVERT) 12.5 MG tablet Take 1 tablet (12.5 mg total) by mouth 3 (three) times daily as needed. 03/27/12   Rosana Hoes, MD  metoprolol succinate (TOPROL-XL) 25 MG 24 hr tablet Take 1 tablet (25 mg total) by mouth daily. 08/29/10   Bartholomew Crews, MD  moxifloxacin (VIGAMOX) 0.5 % ophthalmic solution Place 1 drop into the left eye 3 (three) times daily. After warm soak to eye. 05/09/13   Billy Fischer, MD  nitroGLYCERIN (NITROSTAT) 0.4 MG SL tablet Place 1 tablet (0.4 mg total) under the tongue every 5 (five) minutes as needed for chest pain. 03/05/12 03/05/13  Harden Mo, MD  olopatadine (PATANOL) 0.1 % ophthalmic solution Place 1 drop into both eyes 2 (two) times daily. 10/24/10   Bartholomew Crews, MD  simvastatin (ZOCOR) 20 MG tablet Take 1 tablet (20 mg total) by mouth at bedtime. 10/24/10   Bartholomew Crews, MD  zolpidem (AMBIEN) 10 MG tablet Take 1 tablet (10 mg total) by mouth at bedtime as needed. 08/29/10   Bartholomew Crews, MD   BP  158/101 mmHg  Pulse 64  Temp(Src) 98.1 F (36.7 C) (Oral)  Resp 20  SpO2 98%  LMP 10/24/1978 Physical Exam  Constitutional: She is oriented to person, place, and time. Vital signs are normal. She appears well-developed and well-nourished.  Non-toxic appearance. She appears distressed (uncomfortable appearing).  Afebrile, nontoxic, appears uncomfortable  HENT:  Head: Normocephalic and atraumatic.  Mouth/Throat: Oropharynx is clear and moist and mucous membranes are normal.  Eyes: Conjunctivae and EOM are normal. Right eye exhibits no discharge. Left eye exhibits no discharge.  Neck: Normal range of motion. Neck supple.  Cardiovascular: Normal rate, regular rhythm, normal heart sounds and intact distal pulses.  Exam reveals no gallop and no friction rub.   No murmur heard. Pulmonary/Chest: Effort normal and breath sounds normal. No respiratory distress. She has no decreased breath sounds. She has no wheezes. She has no rhonchi. She has no rales.  Abdominal: Soft. Normal appearance and bowel sounds are normal. She exhibits no distension. There is tenderness in the right upper quadrant and right lower quadrant. There is positive Murphy's sign. There is no rigidity, no rebound, no guarding, no CVA tenderness and no tenderness at McBurney's point.    Soft, nondistended, +BS throughout, with RUQ/RLQ TTP, no r/g/r, +murphy's, neg mcburney's, no CVA TTP   Musculoskeletal: Normal range of motion.  Neurological: She is alert and oriented to person, place, and time. She has normal strength. No sensory deficit.  Skin: Skin is warm, dry and intact. No rash noted.  Psychiatric: She has a normal mood and affect.  Nursing note and vitals reviewed.   ED Course  Procedures (including critical care time) Labs Review Labs Reviewed  CBC WITH DIFFERENTIAL/PLATELET - Abnormal; Notable for the following:    RBC 5.43 (*)    Hemoglobin 15.7 (*)    HCT 46.3 (*)    All other components within normal limits   COMPREHENSIVE METABOLIC PANEL - Abnormal; Notable for the following:    CO2 20 (*)    Glucose, Bld 121 (*)    Creatinine, Ser 1.15 (*)    Calcium 11.0 (*)    GFR calc non Af Amer 47 (*)    GFR calc Af Amer 54 (*)    All other components within normal limits  URINALYSIS, ROUTINE W REFLEX MICROSCOPIC (NOT AT [facility]) - Abnormal; Notable for the following:    APPearance TURBID (*)    Hgb urine dipstick SMALL (*)    Leukocytes, UA MODERATE (*)    All other components within normal limits  URINE MICROSCOPIC-ADD ON - Abnormal; Notable for the following:    Squamous Epithelial / LPF FEW (*)    Bacteria, UA FEW (*)    All other components within normal limits  URINE CULTURE  LIPASE, BLOOD  I-STAT TROPOININ, ED    Imaging Review US Abdomen Complete  04/27/2015  CLINICAL DATA:  69 year old female with acute right abdominal pain with nausea and vomiting for 1 day. EXAM: ULTRASOUND ABDOMEN COMPLETE COMPARISON:  None. FINDINGS: Gallbladder: The gallbladder is unremarkable. There is no evidence of cholelithiasis or acute cholecystitis. Common bile duct: Diameter: 2 mm. There is no evidence of intrahepatic or extrahepatic biliary dilatation. Liver: No focal lesion identified. Within normal limits in parenchymal echogenicity. IVC: No abnormality visualized. Pancreas: Visualized portion unremarkable. Spleen: Size and appearance within normal limits. Right Kidney: Length: 11 cm. Mild right hydronephrosis is identified. No other right renal abnormality noted. Left Kidney: Length: 9.7 cm. Echogenicity within normal limits. No mass or hydronephrosis visualized. Abdominal aorta: No aneurysm visualized. Other findings: None. IMPRESSION: Mild right hydronephrosis with cause not identified - question ureteral obstruction/ calculus. CT may be helpful for further evaluation. Electronically Signed   By: Margarette Canada M.D.   On: 04/27/2015 12:56   Ct Renal Stone Study  04/27/2015  CLINICAL DATA:  69 year old female  with right lower abdominal pain since last night. Nausea and vomiting. Right flank pain. Initial encounter. EXAM: CT ABDOMEN AND PELVIS WITHOUT CONTRAST TECHNIQUE: Multidetector CT imaging of the abdomen and pelvis was performed following the standard protocol without IV contrast. COMPARISON:  CTA  chest and abdomen 09/14/2003. FINDINGS: Stable cardiomegaly. No pericardial or pleural effusion. Mild dependent atelectasis and/or scarring similar to the prior study. No acute osseous abnormality identified. Advanced hip joint degeneration greater on the right. Uterus surgically absent. Adnexa diminutive or absent. No pelvic free fluid. Decompressed rectum. Severe diverticulosis of the sigmoid  colon, but no active inflammation. Mild diverticulosis of the left colon with no active inflammation. Decompressed transverse colon. The cecum is on a lax mesenteric. Negative right colon and terminal ileum. No dilated small bowel. Small fluid-filled gastric hiatal hernia. Mild fluid distension of the stomach. Negative duodenum. Negative noncontrast liver, gallbladder, spleen, adrenal glands. Fatty infiltration of the pancreas. Aortoiliac calcified atherosclerosis noted. Negative left kidney and ureter. Right hydronephrosis and hydroureter with moderate severe perinephric stranding and/or trace para renal space fluid. No calculus within the kidney. The right ureter remains dilated to the ureterovesical junction. There are punctate pelvic phleboliths adjacent to the distal ureter, but within the bladder there is a 6 mm calculus located dependently near midline (series 201, image 81 and sagittal image 93. Otherwise unremarkable urinary bladder. No other abdominal free fluid. IMPRESSION: 1. Acute obstructive uropathy on the right with suggestion of forniceal rupture. There is a 6 mm calculus located dependently in the bladder at midline, compatible with a very recently passed stone. 2. No other urologic calculus. 3. No other acute  findings. Large bowel diverticulosis. Small gastric hiatal hernia. Advanced hip joint degeneration. Electronically Signed   By: Genevie Ann M.D.   On: 04/27/2015 14:11   I have personally reviewed and evaluated these images and lab results as part of my medical decision-making.   EKG Interpretation None      MDM   Final diagnoses:  Right flank pain  Nephrolithiasis  AKI (acute kidney injury) ([place])  Non-intractable vomiting with nausea, vomiting of unspecified type  UTI (lower urinary tract infection)  Diverticulosis of large intestine without hemorrhage    69 y.o. female here with R abd pain x10 hours, n/v x1. On exam, RUQ tenderness with +murphy's, no flank tenderness but DDx includes nephrolithiasis vs cholelithiasis. Appears uncomfortable. Will obtain labs and abd u/s, and will give pain meds, nausea meds, and fluids. Will get troponin and EKG given age and RFs to eval since n/v/abd pain are cardiac equivalents. Will reassess shortly.   1:26 PM U/A with moderate leuks, few squamous, few bacteria, 11-20 WBC, amorphous crystals, will send for culture but doubt that this is UTI although may empirically treat at d/c. Trop neg, EKG unremarkable. CBC w/diff unremarkable. CMP with Cr 1.15, lipase WNL. U/S with R hydronephrosis, will obtain CT to eval for likely kidney stone, to further describe the stone. Pain worsening, nausea returning, will give more pain meds/nausea meds. Will reassess shortly.   2:18 PM CT showing acute obstructive uropathy on R with forniceal rupture and 104mm calculus in the bladder. Will consult urology at this time.  2:25 PM Dr. Lorra Hals of urology returning page, states that given that stone has passed into the bladder, no further intervention needed, and no concern with forniceal rupture. Pain improved greatly, nausea resolved, tolerated PO well. Will treat possible UTI with keflex, and will d/c home with pain meds and nausea meds, discussed staying hydrated with  water, and f/up with urology in 1-2wks PRN for any ongoing issues, and f/up with PCP in 1wk for recheck. I explained the diagnosis and have given explicit precautions to return to the ER including for any other new or worsening symptoms. The patient understands and accepts the medical plan as it's been dictated and I have answered their questions. Discharge instructions concerning home care and prescriptions have been given. The patient is STABLE and is discharged to home in good condition.  BP 166/72 mmHg  Pulse 64  Temp(Src) 98.1 F (36.7 C) (Oral)  Resp 17  SpO2 97%  LMP 10/24/1978  Meds ordered this encounter  Medications  . morphine 4 MG/ML injection 4 mg    Sig:   . ondansetron (ZOFRAN) injection 4 mg    Sig:   . sodium chloride 0.9 % bolus 1,000 mL    Sig:   . metoCLOPramide (REGLAN) injection 10 mg    Sig:   . morphine 4 MG/ML injection 4 mg    Sig:   . diphenhydrAMINE (BENADRYL) injection 25 mg    Sig:   . cephALEXin (KEFLEX) 500 MG capsule    Sig: Take 1 capsule (500 mg total) by mouth 3 (three) times daily. X 5 days    Dispense:  15 capsule    Refill:  0    Order Specific Question:  Supervising Provider    Answer:  MILLER, BRIAN [[id number]]  . ondansetron (ZOFRAN) 8 MG tablet    Sig: Take 1 tablet (8 mg total) by mouth every 8 (eight) hours as needed for nausea or vomiting.    Dispense:  10 tablet    Refill:  0    Order Specific Question:  Supervising Provider    Answer:  Sabra Heck, BRIAN [[id number]]  . HYDROcodone-acetaminophen (NORCO) 5-325 MG tablet    Sig: Take 1 tablet by mouth every 6 (six) hours as needed for severe pain.    Dispense:  10 tablet    Refill:  0    Order Specific Question:  Supervising Provider    Answer:  Noemi Chapel [[id number]]     Lilyrose Tanney Camprubi-Soms, PA-C 04/27/15 1439  Quintella Reichert, MD 04/28/15 [phone]

## 2015-04-27 NOTE — ED Notes (Signed)
Pt o2 dropped down to 72 with good pleth, pt placed on 2L o2 pt up to 98. RN aware

## 2015-04-27 NOTE — ED Notes (Signed)
Pt ambulated to the bathroom with ease 

## 2015-04-27 NOTE — Discharge Instructions (Signed)
Take ibuprofen as directed as needed for pain using norco for breakthrough pain. Do not drive or operate machinery with pain medication use. May need over-the-counter stool softener with this pain medication use. Use Zofran as needed for nausea. Stay well hydrated with plenty of water. Take antibiotic as directed for a possible urinary tract infection. Followup with your regular doctor in 1 week for recheck of symptoms, and with urologist as needed in the next 1 to 2 weeks for recheck of ongoing pain, however for intractable or uncontrollable pain at home then return to the emergency department.     Kidney Stones Kidney stones (urolithiasis) are deposits that form inside your kidneys. The intense pain is caused by the stone moving through the urinary tract. When the stone moves, the ureter goes into spasm around the stone. The stone is usually passed in the urine.  CAUSES   A disorder that makes certain neck glands produce too much parathyroid hormone (primary hyperparathyroidism).  A buildup of uric acid crystals, similar to gout in your joints.  Narrowing (stricture) of the ureter.  A kidney obstruction present at birth (congenital obstruction).  Previous surgery on the kidney or ureters.  Numerous kidney infections. SYMPTOMS   Feeling sick to your stomach (nauseous).  Throwing up (vomiting).  Blood in the urine (hematuria).  Pain that usually spreads (radiates) to the groin.  Frequency or urgency of urination. DIAGNOSIS   Taking a history and physical exam.  Blood or urine tests.  CT scan.  Occasionally, an examination of the inside of the urinary bladder (cystoscopy) is performed. TREATMENT   Observation.  Increasing your fluid intake.  Extracorporeal shock wave lithotripsy--This is a noninvasive procedure that uses shock waves to break up kidney stones.  Surgery may be needed if you have severe pain or persistent obstruction. There are various surgical procedures.  Most of the procedures are performed with the use of small instruments. Only small incisions are needed to accommodate these instruments, so recovery time is minimized. The size, location, and chemical composition are all important variables that will determine the proper choice of action for you. Talk to your health care provider to better understand your situation so that you will minimize the risk of injury to yourself and your kidney.  HOME CARE INSTRUCTIONS   Drink enough water and fluids to keep your urine clear or pale yellow. This will help you to pass the stone or stone fragments.  Strain all urine through the provided strainer. Keep all particulate matter and stones for your health care provider to see. The stone causing the pain may be as small as a grain of salt. It is very important to use the strainer each and every time you pass your urine. The collection of your stone will allow your health care provider to analyze it and verify that a stone has actually passed. The stone analysis will often identify what you can do to reduce the incidence of recurrences.  Only take over-the-counter or prescription medicines for pain, discomfort, or fever as directed by your health care provider.  Keep all follow-up visits as told by your health care provider. This is important.  Get follow-up X-rays if required. The absence of pain does not always mean that the stone has passed. It may have only stopped moving. If the urine remains completely obstructed, it can cause loss of kidney function or even complete destruction of the kidney. It is your responsibility to make sure X-rays and follow-ups are completed. Ultrasounds of  the kidney can show blockages and the status of the kidney. Ultrasounds are not associated with any radiation and can be performed easily in a matter of minutes.  Make changes to your daily diet as told by your health care provider. You may be told to:  Limit the amount of salt  that you eat.  Eat 5 or more servings of fruits and vegetables each day.  Limit the amount of meat, poultry, fish, and eggs that you eat.  Collect a 24-hour urine sample as told by your health care provider.You may need to collect another urine sample every 6-12 months. SEEK MEDICAL CARE IF:  You experience pain that is progressive and unresponsive to any pain medicine you have been prescribed. SEEK IMMEDIATE MEDICAL CARE IF:   Pain cannot be controlled with the prescribed medicine.  You have a fever or shaking chills.  The severity or intensity of pain increases over 18 hours and is not relieved by pain medicine.  You develop a new onset of abdominal pain.  You feel faint or pass out.  You are unable to urinate.   This information is not intended to replace advice given to you by your health care provider. Make sure you discuss any questions you have with your health care provider.   Document Released: 06/18/2005 Document Revised: 03/09/2015 Document Reviewed: 11/19/2012 Elsevier Interactive Patient Education 2016 [place] are compounds that affect the level of uric acid in your body. A low-purine diet is a diet that is low in purines. Eating a low-purine diet can prevent the level of uric acid in your body from getting too high and causing gout or kidney stones or both. WHAT DO I NEED TO KNOW ABOUT THIS DIET?  Choose low-purine foods. Examples of low-purine foods are listed in the next section.  Drink plenty of fluids, especially water. Fluids can help remove uric acid from your body. Try to drink 8-16 cups (1.9-3.8 L) a day.  Limit foods high in fat, especially saturated fat, as fat makes it harder for the body to get rid of uric acid. Foods high in saturated fat include pizza, cheese, ice cream, whole milk, fried foods, and gravies. Choose foods that are lower in fat and lean sources of protein. Use olive oil when cooking as it contains healthy  fats that are not high in saturated fat.  Limit alcohol. Alcohol interferes with the elimination of uric acid from your body. If you are having a gout attack, avoid all alcohol.  Keep in mind that different people's bodies react differently to different foods. You will probably learn over time which foods do or do not affect you. If you discover that a food tends to cause your gout to flare up, avoid eating that food. You can more freely enjoy foods that do not cause problems. If you have any questions about a food item, talk to your dietitian or health care provider. WHICH FOODS ARE LOW, MODERATE, AND HIGH IN PURINES? The following is a list of foods that are low, moderate, and high in purines. You can eat any amount of the foods that are low in purines. You may be able to have small amounts of foods that are moderate in purines. Ask your health care provider how much of a food moderate in purines you can have. Avoid foods high in purines. Grains  Foods low in purines: Enriched white bread, pasta, rice, cake, cornbread, popcorn.  Foods moderate in purines: Whole-grain breads and  cereals, wheat germ, bran, oatmeal. Uncooked oatmeal. Dry wheat bran or wheat germ.  Foods high in purines: Pancakes, [place] toast, biscuits, muffins. Vegetables  Foods low in purines: All vegetables, except those that are moderate in purines.  Foods moderate in purines: Asparagus, cauliflower, spinach, mushrooms, green peas. Fruits  All fruits are low in purines. Meats and other Protein Foods  Foods low in purines: Eggs, nuts, peanut butter.  Foods moderate in purines: 80-90% lean beef, lamb, veal, pork, poultry, fish, eggs, peanut butter, nuts. Crab, lobster, oysters, and shrimp. Cooked dried beans, peas, and lentils.  Foods high in purines: Anchovies, sardines, herring, mussels, tuna, codfish, scallops, trout, and haddock. [place] Salines. Organ meats (such as liver or kidney). Tripe. Game meat. Goose.  Sweetbreads. Dairy  All dairy foods are low in purines. Low-fat and fat-free dairy products are best because they are low in saturated fat. Beverages  Drinks low in purines: Water, carbonated beverages, tea, coffee, cocoa.  Drinks moderate in purines: Soft drinks and other drinks sweetened with high-fructose corn syrup. Juices. To find whether a food or drink is sweetened with high-fructose corn syrup, look at the ingredients list.  Drinks high in purines: Alcoholic beverages (such as beer). Condiments  Foods low in purines: Salt, herbs, olives, pickles, relishes, vinegar.  Foods moderate in purines: Butter, margarine, oils, mayonnaise. Fats and Oils  Foods low in purines: All types, except gravies and sauces made with meat.  Foods high in purines: Gravies and sauces made with meat. Other Foods  Foods low in purines: Sugars, sweets, gelatin. Cake. Soups made without meat.  Foods moderate in purines: Meat-based or fish-based soups, broths, or bouillons. Foods and drinks sweetened with high-fructose corn syrup.  Foods high in purines: High-fat desserts (such as ice cream, cookies, cakes, pies, doughnuts, and chocolate). Contact your dietitian for more information on foods that are not listed here.   This information is not intended to replace advice given to you by your health care provider. Make sure you discuss any questions you have with your health care provider.   Document Released: 10/13/2010 Document Revised: 06/23/2013 Document Reviewed: 05/25/2013 Elsevier Interactive Patient Education 2016 [employer]  Nausea and Vomiting Nausea is a sick feeling that often comes before throwing up (vomiting). Vomiting is a reflex where stomach contents come out of your mouth. Vomiting can cause severe loss of body fluids (dehydration). Children and elderly adults can become dehydrated quickly, especially if they also have diarrhea. Nausea and vomiting are symptoms of a condition or  disease. It is important to find the cause of your symptoms. CAUSES   Direct irritation of the stomach lining. This irritation can result from increased acid production (gastroesophageal reflux disease), infection, food poisoning, taking certain medicines (such as nonsteroidal anti-inflammatory drugs), alcohol use, or tobacco use.  Signals from the brain.These signals could be caused by a headache, heat exposure, an inner ear disturbance, increased pressure in the brain from injury, infection, a tumor, or a concussion, pain, emotional stimulus, or metabolic problems.  An obstruction in the gastrointestinal tract (bowel obstruction).  Illnesses such as diabetes, hepatitis, gallbladder problems, appendicitis, kidney problems, cancer, sepsis, atypical symptoms of a heart attack, or eating disorders.  Medical treatments such as chemotherapy and radiation.  Receiving medicine that makes you sleep (general anesthetic) during surgery. DIAGNOSIS Your caregiver may ask for tests to be done if the problems do not improve after a few days. Tests may also be done if symptoms are severe or if the reason for the  nausea and vomiting is not clear. Tests may include:  Urine tests.  Blood tests.  Stool tests.  Cultures (to look for evidence of infection).  X-rays or other imaging studies. Test results can help your caregiver make decisions about treatment or the need for additional tests. TREATMENT You need to stay well hydrated. Drink frequently but in small amounts.You may wish to drink water, sports drinks, clear broth, or eat frozen ice pops or gelatin dessert to help stay hydrated.When you eat, eating slowly may help prevent nausea.There are also some antinausea medicines that may help prevent nausea. HOME CARE INSTRUCTIONS   Take all medicine as directed by your caregiver.  If you do not have an appetite, do not force yourself to eat. However, you must continue to drink fluids.  If you  have an appetite, eat a normal diet unless your caregiver tells you differently.  Eat a variety of complex carbohydrates (rice, wheat, potatoes, bread), lean meats, yogurt, fruits, and vegetables.  Avoid high-fat foods because they are more difficult to digest.  Drink enough water and fluids to keep your urine clear or pale yellow.  If you are dehydrated, ask your caregiver for specific rehydration instructions. Signs of dehydration may include:  Severe thirst.  Dry lips and mouth.  Dizziness.  Dark urine.  Decreasing urine frequency and amount.  Confusion.  Rapid breathing or pulse. SEEK IMMEDIATE MEDICAL CARE IF:   You have blood or brown flecks (like coffee grounds) in your vomit.  You have black or bloody stools.  You have a severe headache or stiff neck.  You are confused.  You have severe abdominal pain.  You have chest pain or trouble breathing.  You do not urinate at least once every 8 hours.  You develop cold or clammy skin.  You continue to vomit for longer than 24 to 48 hours.  You have a fever. MAKE SURE YOU:   Understand these instructions.  Will watch your condition.  Will get help right away if you are not doing well or get worse.   This information is not intended to replace advice given to you by your health care provider. Make sure you discuss any questions you have with your health care provider.   Document Released: 06/18/2005 Document Revised: 09/10/2011 Document Reviewed: 11/15/2010 Elsevier Interactive Patient Education 2016 [place].  Renal Colic Renal colic is pain that is caused by a kidney stone. HOME CARE  Take medicines only as told by your doctor.  Ask your doctor if it is okay to take over-the-counter medicine for pain.  Drink enough fluid to keep your pee (urine) clear or pale yellow. Drink 6-8 glasses of water each day.  Eat less than 2 grams of salt per day.  Eat less protein. Some foods that have protein are  meats, fish, nuts, and dairy.  Try not to eat spinach, rhubarb, nuts, or bran. GET HELP IF:  You have a fever or chills.  Your pee smells bad or looks cloudy.  You have pain or burning when you pee. GET HELP RIGHT AWAY IF:  The pain in your side (flank) or your groin suddenly gets worse.  You get confused.  You pass out.   This information is not intended to replace advice given to you by your health care provider. Make sure you discuss any questions you have with your health care provider.   Document Released: 12/05/2007 Document Revised: 07/09/2014 Document Reviewed: 04/28/2014 Elsevier Interactive Patient Education [employer].  Urinary  Tract Infection A urinary tract infection (UTI) can occur any place along the urinary tract. The tract includes the kidneys, ureters, bladder, and urethra. A type of germ called bacteria often causes a UTI. UTIs are often helped with antibiotic medicine.  HOME CARE   If given, take antibiotics as told by your doctor. Finish them even if you start to feel better.  Drink enough fluids to keep your pee (urine) clear or pale yellow.  Avoid tea, drinks with caffeine, and bubbly (carbonated) drinks.  Pee often. Avoid holding your pee in for a long time.  Pee before and after having sex (intercourse).  Wipe from front to back after you poop (bowel movement) if you are a woman. Use each tissue only once. GET HELP RIGHT AWAY IF:   You have back pain.  You have lower belly (abdominal) pain.  You have chills.  You feel sick to your stomach (nauseous).  You throw up (vomit).  Your burning or discomfort with peeing does not go away.  You have a fever.  Your symptoms are not better in 3 days. MAKE SURE YOU:   Understand these instructions.  Will watch your condition.  Will get help right away if you are not doing well or get worse.   This information is not intended to replace advice given to you by your health care provider.  Make sure you discuss any questions you have with your health care provider.   Document Released: 12/05/2007 Document Revised: 07/09/2014 Document Reviewed: 01/17/2012 Elsevier Interactive Patient Education [employer].

## 2015-04-27 NOTE — ED Notes (Signed)
Pt. Presents with complaint of R lower abd pain starting last PM. Pt. States she has been nauseous and vomited once. Denies Diarrhea. Denies hx of kidney stones.

## 2015-04-29 LAB — URINE CULTURE

## 2022-03-02 IMAGING — MR MRI CERVICAL SPINE WITHOUT CONTRAST
6 series · 48 of 48 positions shown · non-contrast
Comparison: none

﻿ MR CERVICAL SPINE
INDICATION: Neck pain.
TECHNIQUE: Multisequence multiplanar MRI of the cervical spine without contrast was performed on a low field strength open MRI scanner using standard protocol including sagittal T1 and T2; axial T1 and gradient echo; coronal T2 sequences without contrast.

[Series 2: scano sag/cor · coronal · 6.0mm · 1.02mm/px · 4 of 6 slices shown]
[im 1/6]
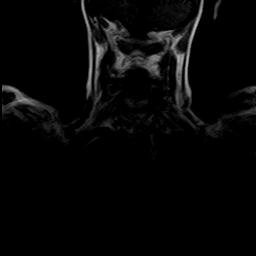
[im 2/6]
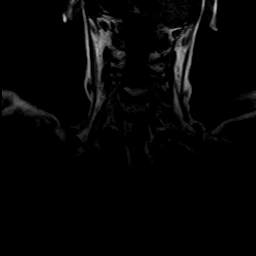
[im 4/6]
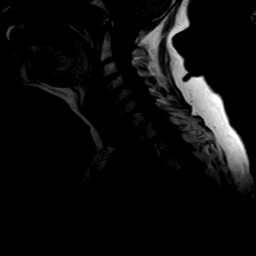
[im 6/6]
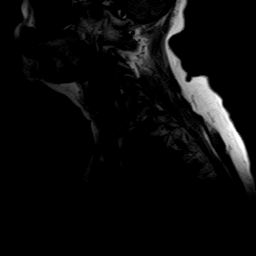

[Series 3: T2 · sagittal · 3.5mm · 0.94mm/px · 6 of 11 slices shown (1 of 2)]
[im 1/11]
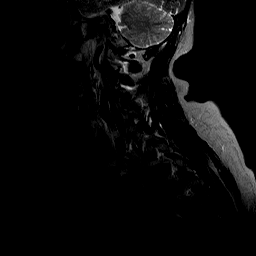
[im 3/11]
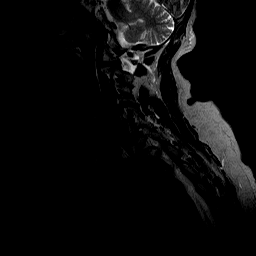
[im 5/11]
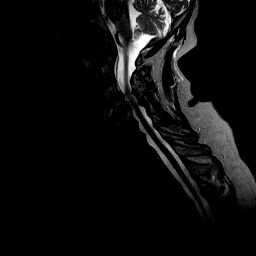
[im 7/11]
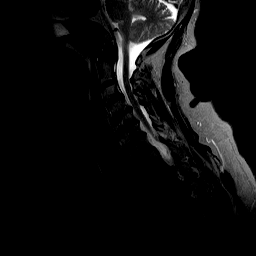
[im 9/11]
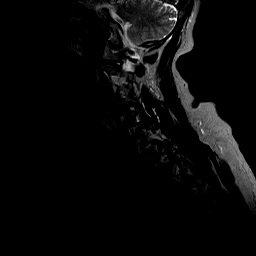
[im 11/11]
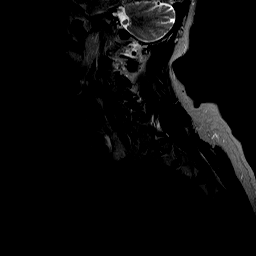

[Series 4: T1 · sagittal · 3.5mm · 0.94mm/px · 6 of 11 slices shown (1 of 2)]
[im 1/11]
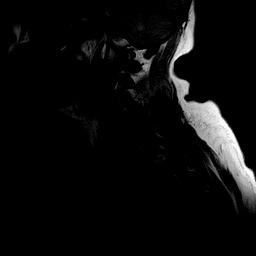
[im 3/11]
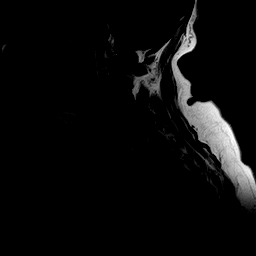
[im 5/11]
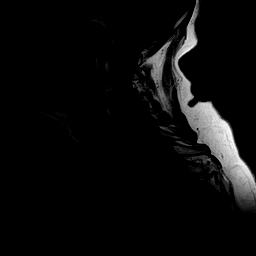
[im 7/11]
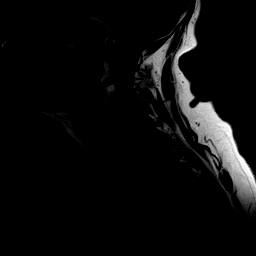
[im 9/11]
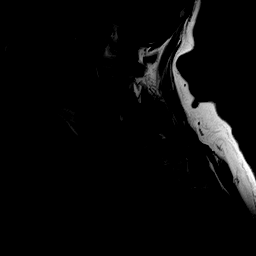
[im 11/11]
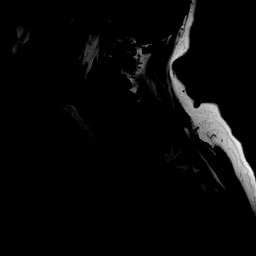

[Series 5: ge trs w/mtc · axial · 3.5mm · 0.78mm/px · z∈[-52,+34]mm · 12 of 22 slices shown]
[im 1/22]
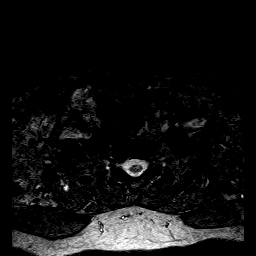
[im 2/22]
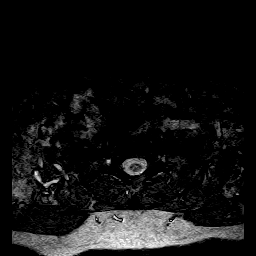
[im 4/22]
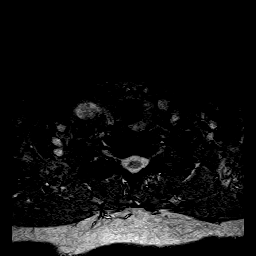
[im 6/22]
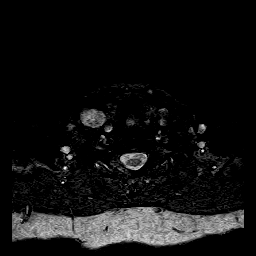
[im 8/22]
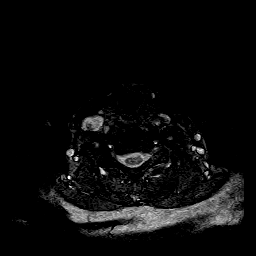
[im 10/22]
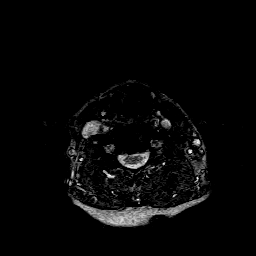
[im 12/22]
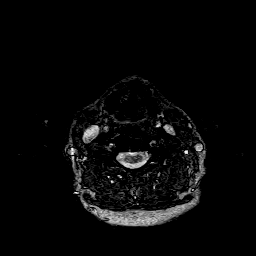
[im 14/22]
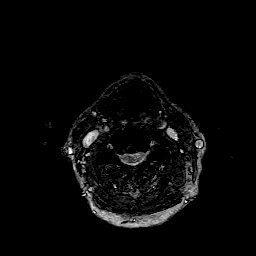
[im 16/22]
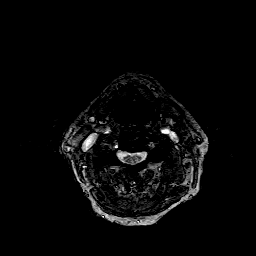
[im 18/22]
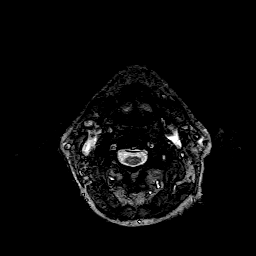
[im 20/22]
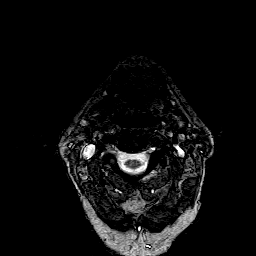
[im 22/22]
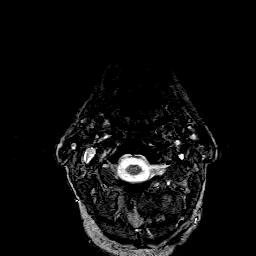

[Series 6: T1 · axial · 3.5mm · 0.78mm/px · z∈[-52,+34]mm · 12 of 22 slices shown (2 of 2)]
[im 1/22]
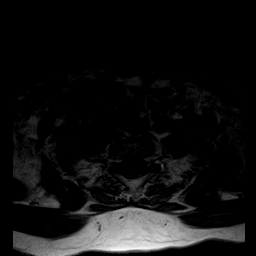
[im 2/22]
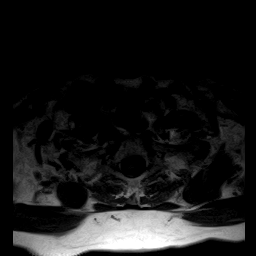
[im 4/22]
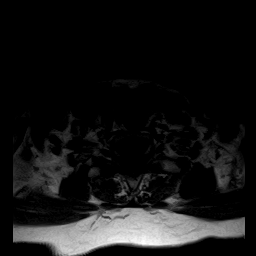
[im 6/22]
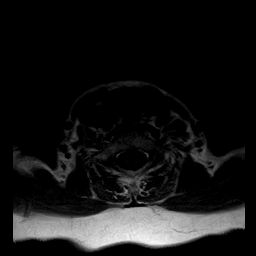
[im 8/22]
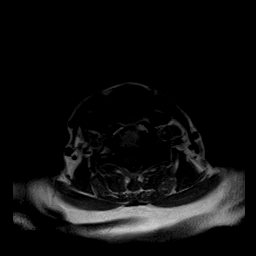
[im 10/22]
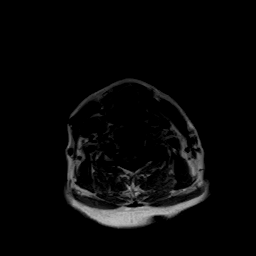
[im 12/22]
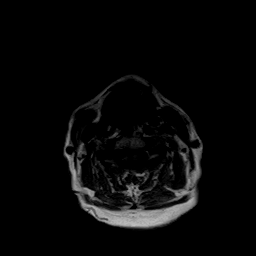
[im 14/22]
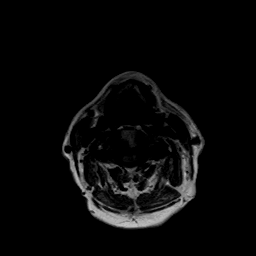
[im 16/22]
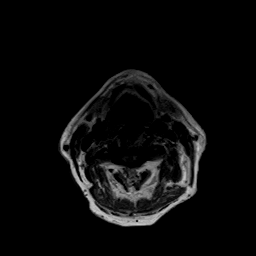
[im 18/22]
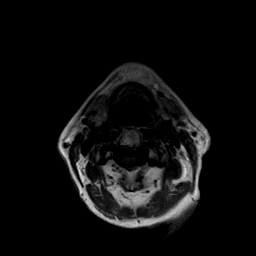
[im 20/22]
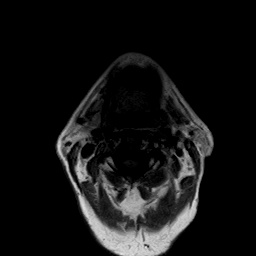
[im 22/22]
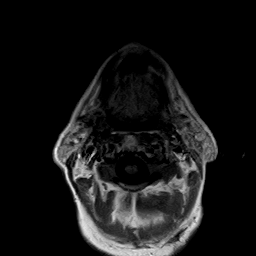

[Series 7: T2 · coronal · 4.0mm · 0.78mm/px · 8 of 14 slices shown (2 of 2)]
[im 1/14]
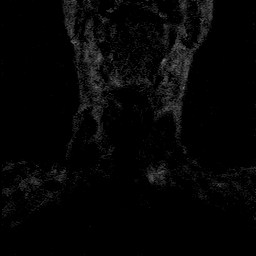
[im 2/14]
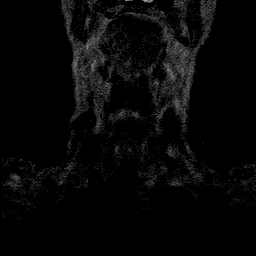
[im 4/14]
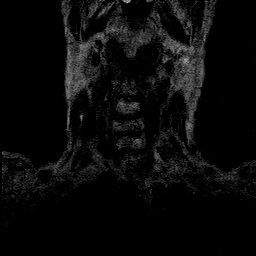
[im 6/14]
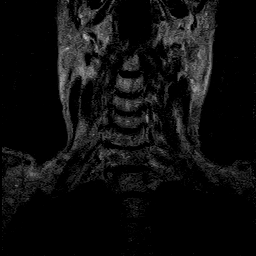
[im 8/14]
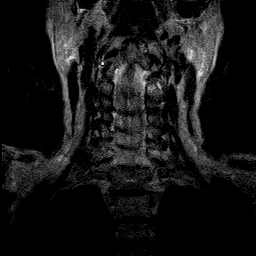
[im 10/14]
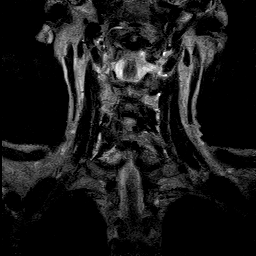
[im 12/14]
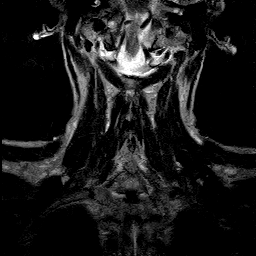
[im 14/14]
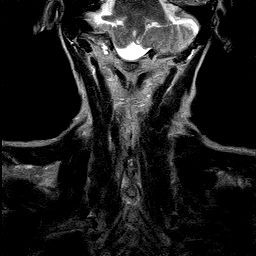

[48 of 48 positions shown; findings below may reference images not displayed]

FINDINGS: Visualized portions of the posterior fossa are unremarkable. The bone marrow and cord signal appears normal. There are no masses in the central canal or paraspinal region seen. Prevertebral soft tissues are normal. The atlanto-dens interval is intact. The craniocervical junction appears normal.  There is stepladder degenerative grade 1 anterolisthesis of C3, C4, C5, C6 and C7.  Specific findings are seen at the following levels:

C2-C3: Disc osteophyte complex.  No disc herniation, central canal stenosis, or neuroforaminal narrowing.

C3-C4: Disc osteophyte complex with posterior ridging.  No central canal stenosis.  Uncovertebral joint hypertrophy contributing to moderate bilateral neuroforaminal narrowing.

C4-C5:  Disc osteophyte complex with posterior ridging contributing to mild central canal stenosis.  Uncovertebral joint hypertrophy resulting in moderate bilateral neuroforaminal narrowing.

C5-C6: Disc osteophyte complex with posterior ridging contributing to mild central canal stenosis.  Uncovertebral joint hypertrophy resulting in moderate bilateral neuroforaminal narrowing.

C6-C7:  Disc osteophyte complex with posterior ridging contributing to mild central canal stenosis.  Uncovertebral joint hypertrophy resulting in moderate bilateral neuroforaminal narrowing.

C7-T1: No disc herniation or central canal stenosis.  Uncovertebral joint hypertrophy contributing to mild to moderate bilateral neuroforaminal narrowing.
IMPRESSION: Multilevel degenerative disc disease and facet arthrosis with mild central canal stenosis and moderate bilateral neuroforaminal narrowing at C4-5, C5-6, and C6-7.

## 2022-03-02 IMAGING — MR MRI LUMBAR SPINE WITHOUT CONTRAST
6 series · 48 of 48 positions shown · non-contrast
Comparison: none

﻿ MRI LUMBAR SPINE
INDICATION: Back pain
TECHNIQUE: MRI lumbar spine was performed. Multiplanar multisequence imaging was performed using standard protocol on a low field strength open MRI scanner.

[Series 2: s-c scano · coronal · 6.0mm · 1.17mm/px · 3 of 6 slices shown]
[im 1/6]
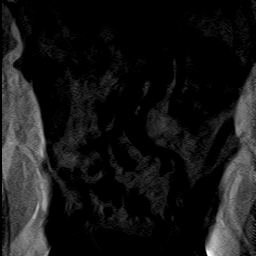
[im 3/6]
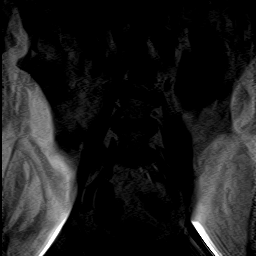
[im 6/6]
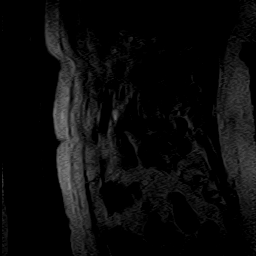

[Series 3: T2 · sagittal · 4.0mm · 1.17mm/px · 7 of 14 slices shown (1 of 3)]
[im 1/14]
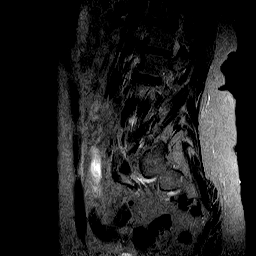
[im 3/14]
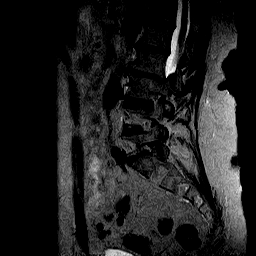
[im 5/14]
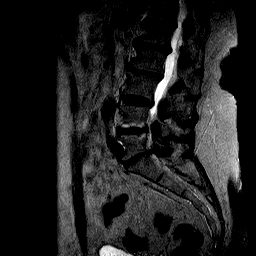
[im 7/14]
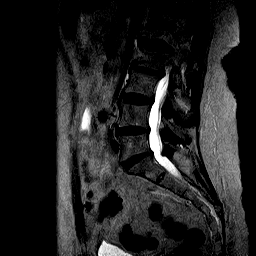
[im 9/14]
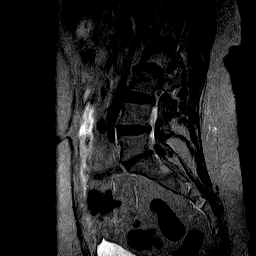
[im 11/14]
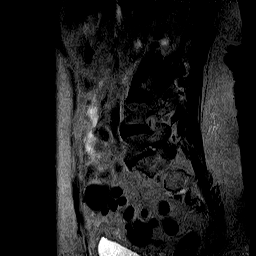
[im 14/14]
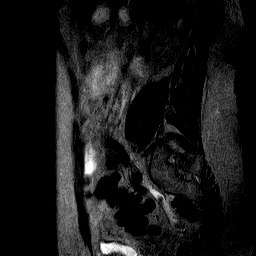

[Series 4: T1 · sagittal · 4.0mm · 1.17mm/px · 7 of 14 slices shown (1 of 2)]
[im 1/14]
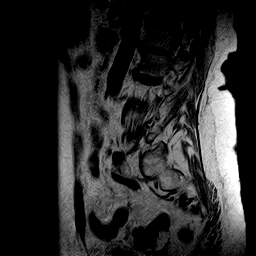
[im 3/14]
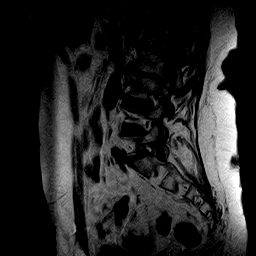
[im 5/14]
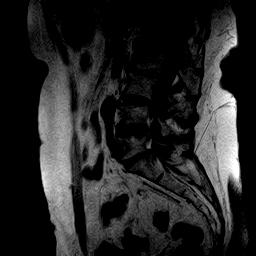
[im 7/14]
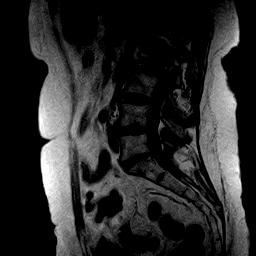
[im 9/14]
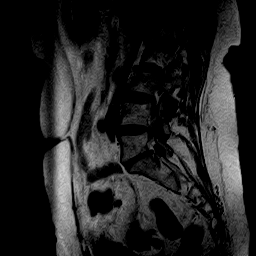
[im 11/14]
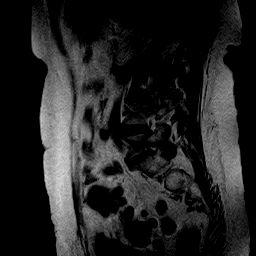
[im 14/14]
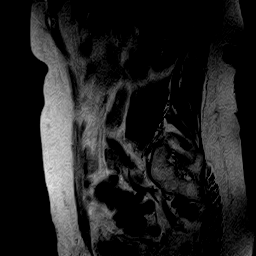

[Series 5: T2 · axial · 4.0mm · 0.98mm/px · z∈[-82,+111]mm · 11 of 20 slices shown (2 of 3)]
[im 1/20]
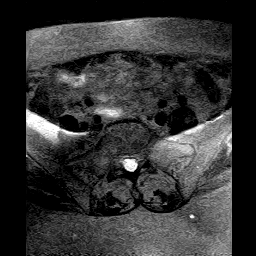
[im 2/20]
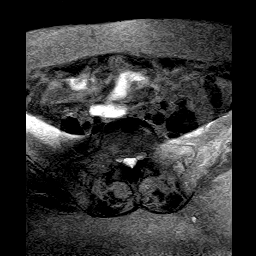
[im 4/20]
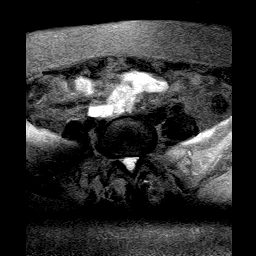
[im 6/20]
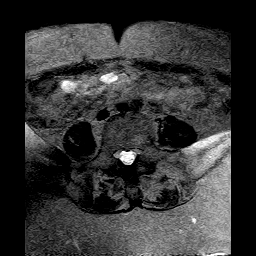
[im 8/20]
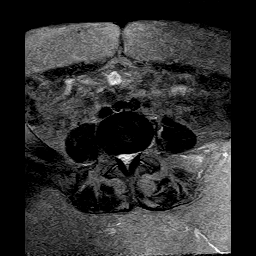
[im 10/20]
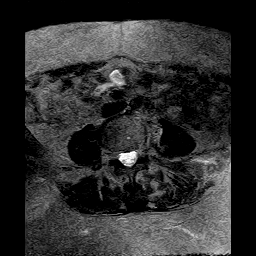
[im 12/20]
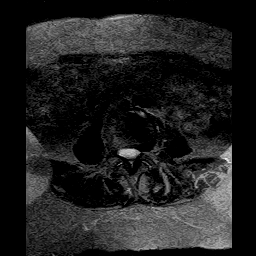
[im 14/20]
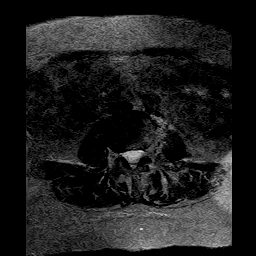
[im 16/20]
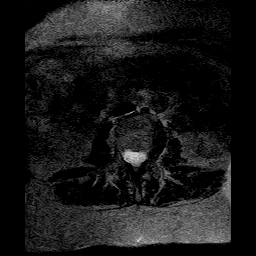
[im 18/20]
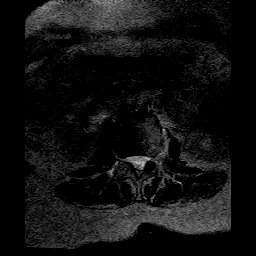
[im 20/20]
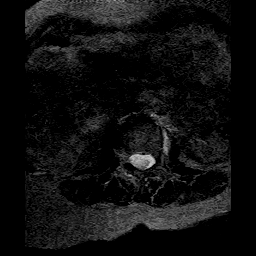

[Series 6: T1 · axial · 4.0mm · 0.98mm/px · z∈[-82,+111]mm · 11 of 20 slices shown (2 of 2)]
[im 1/20]
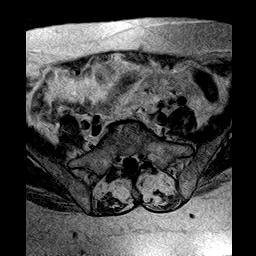
[im 2/20]
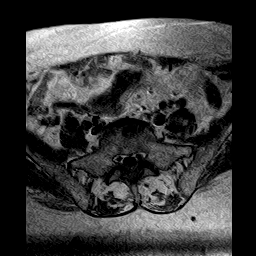
[im 4/20]
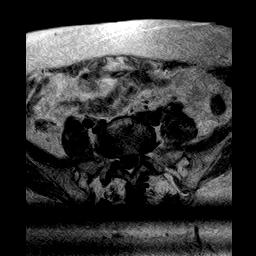
[im 6/20]
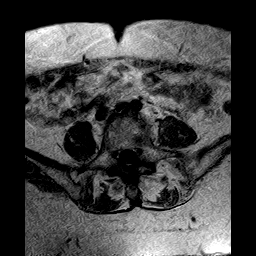
[im 8/20]
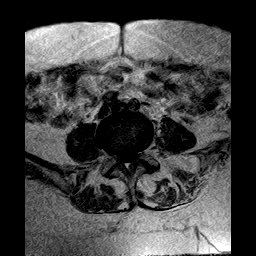
[im 10/20]
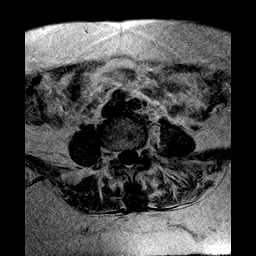
[im 12/20]
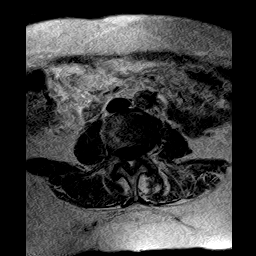
[im 14/20]
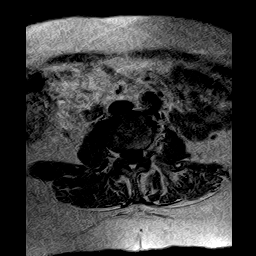
[im 16/20]
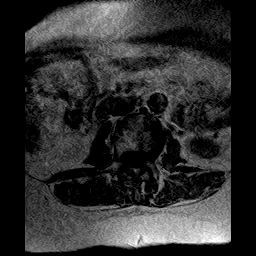
[im 18/20]
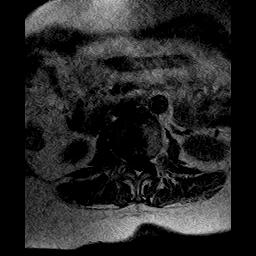
[im 20/20]
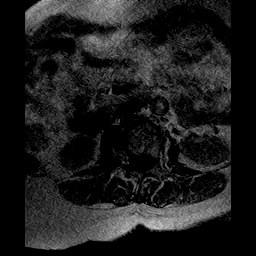

[Series 7: T2 · coronal · 4.0mm · 1.17mm/px · 9 of 16 slices shown (3 of 3)]
[im 1/16]
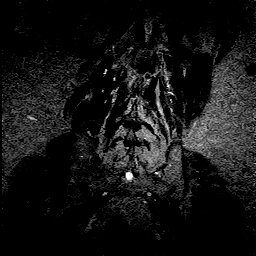
[im 2/16]
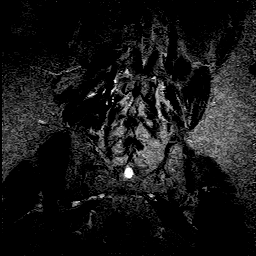
[im 4/16]
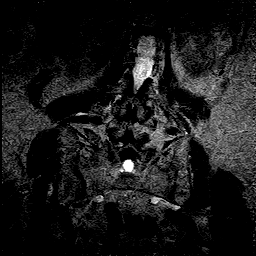
[im 6/16]
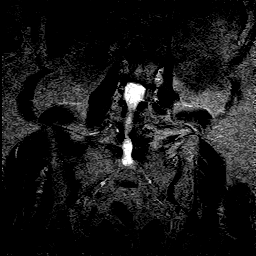
[im 8/16]
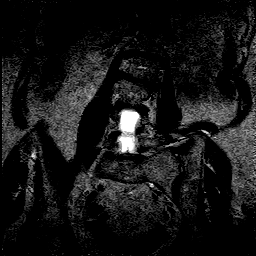
[im 10/16]
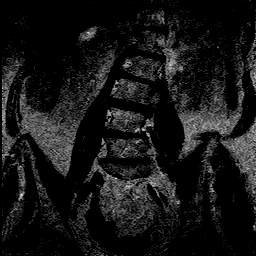
[im 12/16]
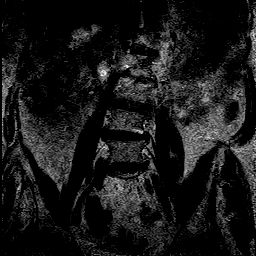
[im 14/16]
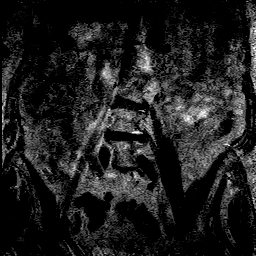
[im 16/16]
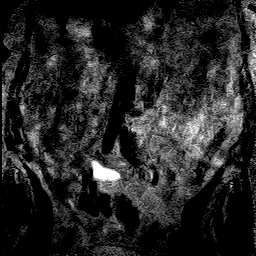

[48 of 48 positions shown; findings below may reference images not displayed]

FINDINGS: There are 5 lumbar type vertebra.  A mild levolumbar scoliotic curve.  Vertebral body heights, vertebral alignment, and marrow signal are within normal limits.  Specific findings are identified at the following levels: 

L1-L2: Not included on the axial images.  There is degenerative disc desiccation and osteophyte formation.

L2-L3: Moderate intervertebral disc space narrowing with disc desiccation and osteophyte formation.  Circumferential disc bulging and ligamentum flavum infolding contribute to mild central canal stenosis.  No neuroforaminal narrowing.

L3-L4: Degenerative disc desiccation and osteophyte formation.  Circumferential disc bulging and ligamentum flavum infolding contributing to mild central canal stenosis.  No neuroforaminal narrowing.

L4-L5: Degenerative disc desiccation and osteophyte formation.  Circumferential disc bulging and ligamentum flavum infolding contributing to moderate central canal stenosis.  No neuroforaminal narrowing.

L5-S1: Moderate intervertebral disc space narrowing with disc desiccation and osteophyte formation.  Circumferential disc bulging and ligamentum flavum infolding contributing to moderate central canal stenosis.  Disc bulging, posterolateral osteophytes, and facet arthrosis contribute to moderate bilateral neuroforaminal narrowing.

The visualized SI joints, paraspinal muscles and retroperitoneum are unremarkable.
IMPRESSION: 1. Degenerative disc disease with mild central canal stenosis at L2-3, L3-4 and L4-5.

2. Moderate to severe degenerative disc disease at L5-S1 with moderate central canal stenosis and moderate bilateral neuroforaminal narrowing.

## 2023-06-05 IMAGING — MR MRI LUMBAR SPINE WITHOUT CONTRAST
6 series · 48 of 48 positions shown · non-contrast
Comparison: Lumbar spine MRI 03/02/2022

﻿

MRI LUMBAR SPINE
INDICATION: Back pain
TECHNIQUE: MRI lumbar spine was performed. Multiplanar multisequence imaging was performed using standard protocol on a low field strength open MRI scanner.

[Series 3: s-c scano · coronal · 6.0mm · 1.17mm/px · 2 of 6 slices shown]
[im 1/6]
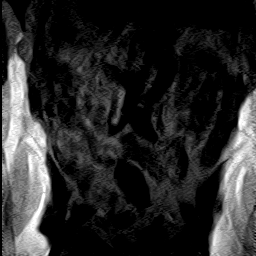
[im 6/6]
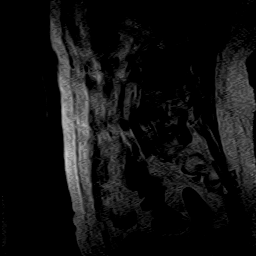

[Series 4: T2 · sagittal · 4.0mm · 1.17mm/px · 8 of 14 slices shown (1 of 3)]
[im 1/14]
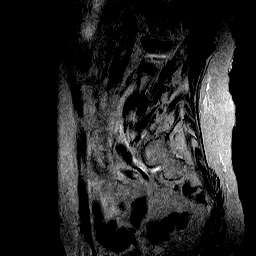
[im 2/14]
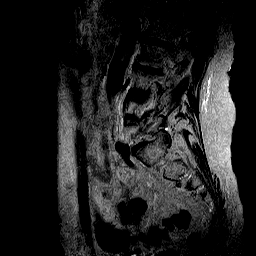
[im 4/14]
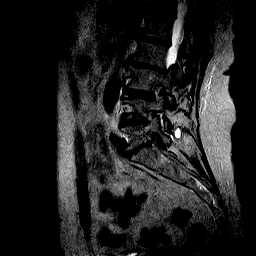
[im 6/14]
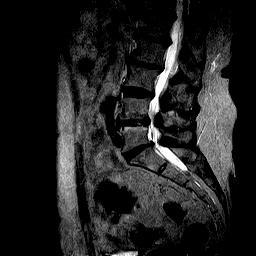
[im 8/14]
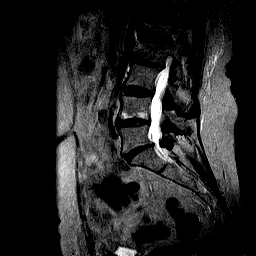
[im 10/14]
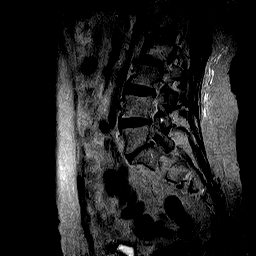
[im 12/14]
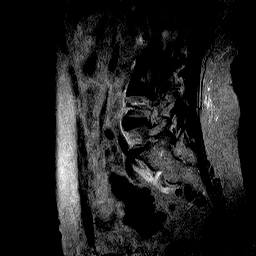
[im 14/14]
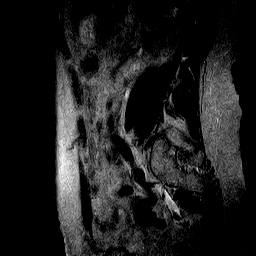

[Series 5: T1 · sagittal · 4.0mm · 1.17mm/px · 8 of 14 slices shown (1 of 2)]
[im 1/14]
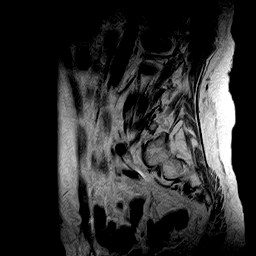
[im 2/14]
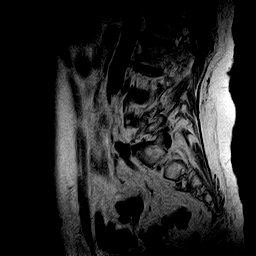
[im 4/14]
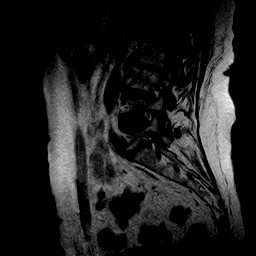
[im 6/14]
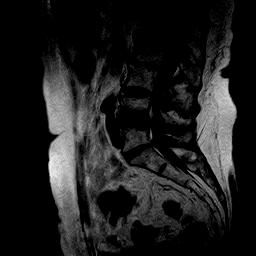
[im 8/14]
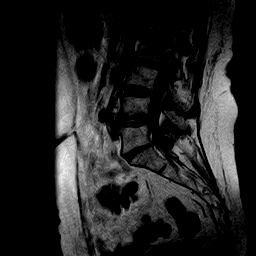
[im 10/14]
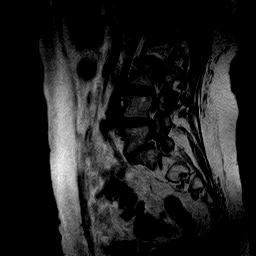
[im 12/14]
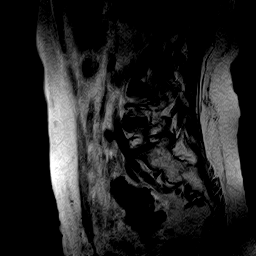
[im 14/14]
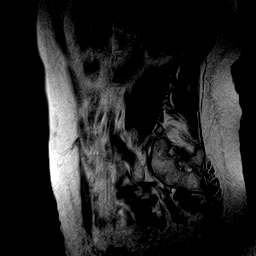

[Series 6: T2 · axial · 4.0mm · 0.98mm/px · z∈[-65,+159]mm · 11 of 20 slices shown (2 of 3)]
[im 1/20]
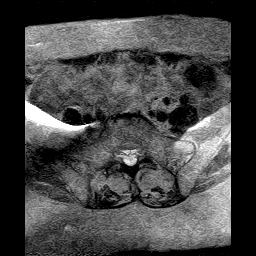
[im 2/20]
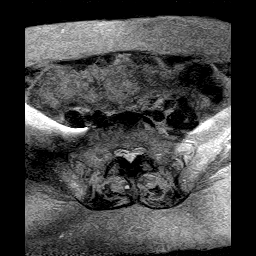
[im 4/20]
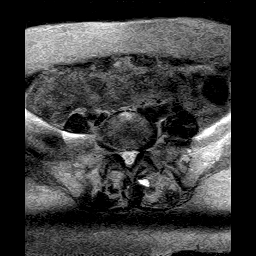
[im 6/20]
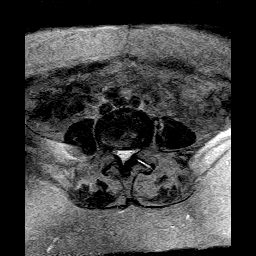
[im 8/20]
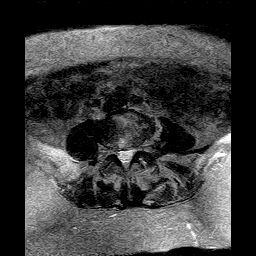
[im 10/20]
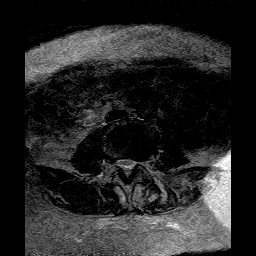
[im 12/20]
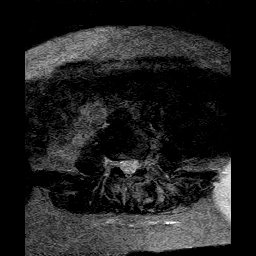
[im 14/20]
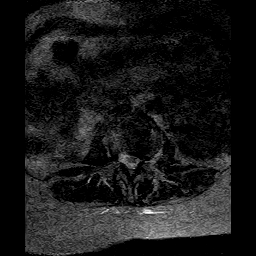
[im 16/20]
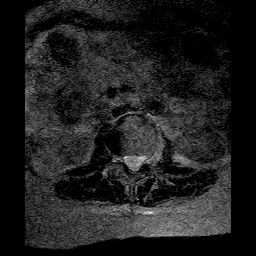
[im 18/20]
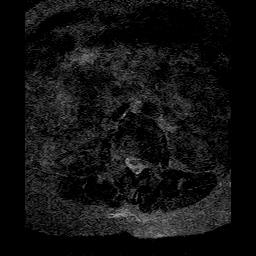
[im 20/20]
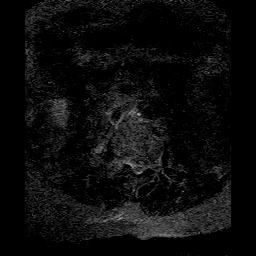

[Series 7: T1 · axial · 4.0mm · 0.98mm/px · z∈[-65,+159]mm · 11 of 20 slices shown (2 of 2)]
[im 1/20]
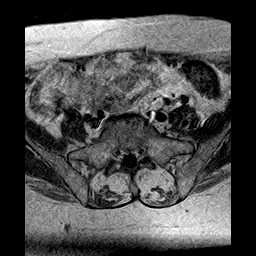
[im 2/20]
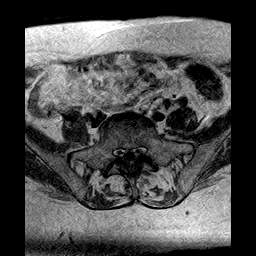
[im 4/20]
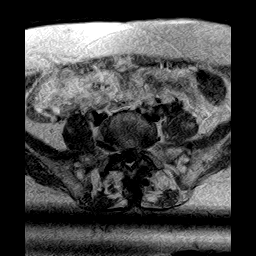
[im 6/20]
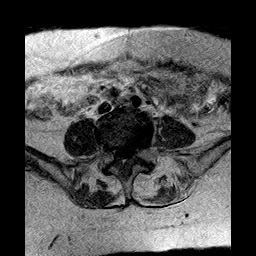
[im 8/20]
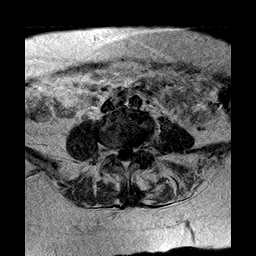
[im 10/20]
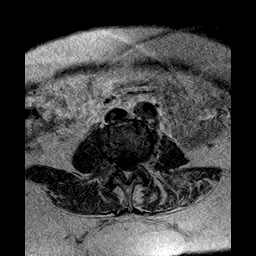
[im 12/20]
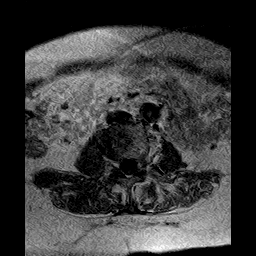
[im 14/20]
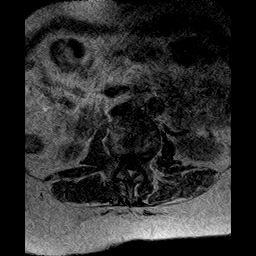
[im 16/20]
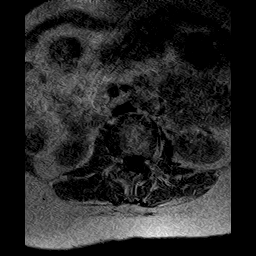
[im 18/20]
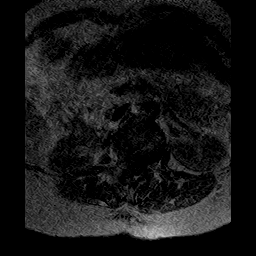
[im 20/20]
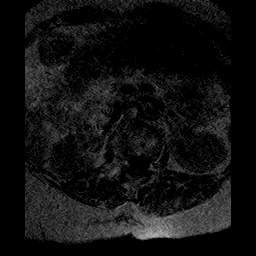

[Series 8: T2 · coronal · 4.0mm · 1.17mm/px · 8 of 14 slices shown (3 of 3)]
[im 1/14]
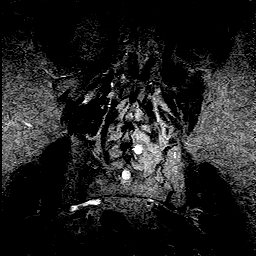
[im 2/14]
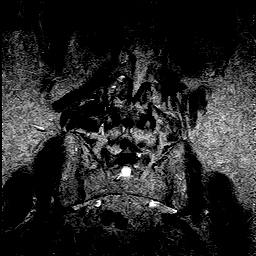
[im 4/14]
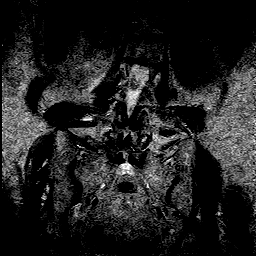
[im 6/14]
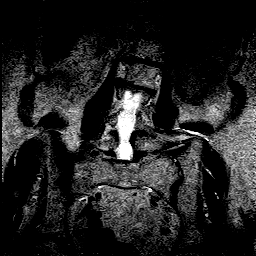
[im 8/14]
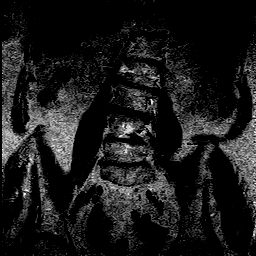
[im 10/14]
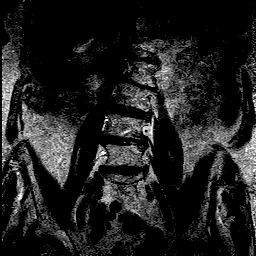
[im 12/14]
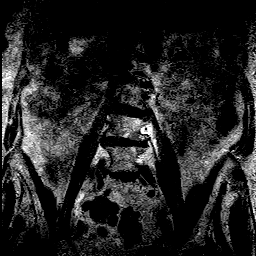
[im 14/14]
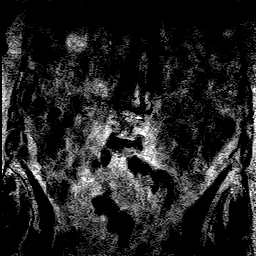

[48 of 48 positions shown; findings below may reference images not displayed]

FINDINGS: There are 5 lumbar type vertebra.  There is a levolumbar scoliotic curve apex L2-3.  Vertebral body heights, vertebral alignment, and marrow signal are within normal limits.  Specific findings are identified at the following levels: 

L1-L2:  Moderate intervertebral disc space narrowing with degenerative disc desiccation and osteophyte formation.  Circumferential disc bulging ligamentum flavum infolding contributes to mild central canal stenosis.  No neural foraminal narrowing.  Findings are unchanged.

L2-L3:  Moderate intervertebral disc space narrowing with degenerative disc desiccation and osteophyte formation.  Circumferential disc bulging ligamentum flavum infolding contributes to mild central canal stenosis.  No neural foraminal narrowing.  Findings are unchanged.

L3-L4:  Mild intervertebral disc space narrowing with degenerative disc desiccation and osteophyte formation.  Circumferential disc bulging ligamentum flavum infolding contributes to mild central canal stenosis.  No neural foraminal narrowing.  Findings are unchanged

L4-L5:  Moderate intervertebral disc space narrowing with degenerative disc desiccation, osteophyte formation, and reactive type 1 changes.  Circumferential disc bulging and ligamentum flavum infolding contributes to moderate central canal stenosis.  No neural foraminal narrowing.  Findings are unchanged

L5-S1:  Moderate-to-severe intervertebral disc space narrowing with degenerative disc desiccation and osteophyte formation.  Circumferential disc and ligamentum flavum infolding contributes to moderate central canal stenosis.  Additionally, posterolateral osteophytes, intraforaminal disc bulging, and facet joint arthrosis contribute to moderate-to-severe bilateral neural foraminal narrowing, unchanged.

The visualized SI joints, paraspinal muscles and retroperitoneum are unremarkable.
IMPRESSION: Stable examination compared to the lumabr spnie MRI of 03/02/2022.

Degenerative disc disease with mild central canal stenosis at 2, L2-3 and L3-4, unchanged.

Degenerative disc disease at L4-5 with moderate central canal stenosis, unchanged.  

Degenerative disc disease and facet joint arthrosis at L5-S1 contributing to moderate central canal stenosis and moderate-to-severe bilateral neural foraminal narrowing, unchanged
